# Patient Record
Sex: Female | Born: 1985 | Marital: Married | Smoking: Never smoker
Health system: Southern US, Community
[De-identification: ages and names within clinical notes are randomized; demographics above are authoritative.]

## PROBLEM LIST (undated history)

## (undated) ENCOUNTER — Inpatient Hospital Stay (HOSPITAL_COMMUNITY): Payer: Self-pay

## (undated) DIAGNOSIS — N289 Disorder of kidney and ureter, unspecified: Secondary | ICD-10-CM

## (undated) DIAGNOSIS — I10 Essential (primary) hypertension: Secondary | ICD-10-CM

## (undated) DIAGNOSIS — J45909 Unspecified asthma, uncomplicated: Secondary | ICD-10-CM

## (undated) DIAGNOSIS — E7800 Pure hypercholesterolemia, unspecified: Secondary | ICD-10-CM

## (undated) HISTORY — PX: WISDOM TOOTH EXTRACTION: SHX21

## (undated) HISTORY — PX: DILATION AND CURETTAGE OF UTERUS: SHX78

---

## 2018-07-25 ENCOUNTER — Encounter: Payer: Self-pay | Admitting: Student

## 2018-07-25 ENCOUNTER — Inpatient Hospital Stay (HOSPITAL_COMMUNITY)
Admission: AD | Admit: 2018-07-25 | Discharge: 2018-07-30 | DRG: 832 | Disposition: A | Discharge status: Home / Self Care | Payer: Managed Care, Other (non HMO) | Attending: Obstetrics and Gynecology | Admitting: Obstetrics and Gynecology

## 2018-07-25 ENCOUNTER — Other Ambulatory Visit: Payer: Self-pay

## 2018-07-25 DIAGNOSIS — O99512 Diseases of the respiratory system complicating pregnancy, second trimester: Secondary | ICD-10-CM | POA: Diagnosis present

## 2018-07-25 DIAGNOSIS — R809 Proteinuria, unspecified: Secondary | ICD-10-CM

## 2018-07-25 DIAGNOSIS — R7989 Other specified abnormal findings of blood chemistry: Secondary | ICD-10-CM | POA: Diagnosis not present

## 2018-07-25 DIAGNOSIS — Z3A27 27 weeks gestation of pregnancy: Secondary | ICD-10-CM | POA: Diagnosis not present

## 2018-07-25 DIAGNOSIS — O1212 Gestational proteinuria, second trimester: Secondary | ICD-10-CM | POA: Diagnosis not present

## 2018-07-25 DIAGNOSIS — O10912 Unspecified pre-existing hypertension complicating pregnancy, second trimester: Secondary | ICD-10-CM | POA: Diagnosis present

## 2018-07-25 DIAGNOSIS — O10012 Pre-existing essential hypertension complicating pregnancy, second trimester: Secondary | ICD-10-CM | POA: Diagnosis not present

## 2018-07-25 DIAGNOSIS — O10212 Pre-existing hypertensive chronic kidney disease complicating pregnancy, second trimester: Secondary | ICD-10-CM | POA: Diagnosis present

## 2018-07-25 DIAGNOSIS — Z3A26 26 weeks gestation of pregnancy: Secondary | ICD-10-CM

## 2018-07-25 DIAGNOSIS — N189 Chronic kidney disease, unspecified: Secondary | ICD-10-CM | POA: Diagnosis present

## 2018-07-25 DIAGNOSIS — E871 Hypo-osmolality and hyponatremia: Secondary | ICD-10-CM | POA: Diagnosis present

## 2018-07-25 DIAGNOSIS — O99212 Obesity complicating pregnancy, second trimester: Secondary | ICD-10-CM | POA: Diagnosis not present

## 2018-07-25 DIAGNOSIS — O112 Pre-existing hypertension with pre-eclampsia, second trimester: Secondary | ICD-10-CM | POA: Diagnosis present

## 2018-07-25 DIAGNOSIS — I129 Hypertensive chronic kidney disease with stage 1 through stage 4 chronic kidney disease, or unspecified chronic kidney disease: Secondary | ICD-10-CM | POA: Diagnosis present

## 2018-07-25 DIAGNOSIS — O99282 Endocrine, nutritional and metabolic diseases complicating pregnancy, second trimester: Secondary | ICD-10-CM | POA: Diagnosis present

## 2018-07-25 DIAGNOSIS — J45909 Unspecified asthma, uncomplicated: Secondary | ICD-10-CM | POA: Diagnosis not present

## 2018-07-25 DIAGNOSIS — Z363 Encounter for antenatal screening for malformations: Secondary | ICD-10-CM | POA: Diagnosis not present

## 2018-07-25 HISTORY — DX: Unspecified asthma, uncomplicated: J45.909

## 2018-07-25 HISTORY — DX: Essential (primary) hypertension: I10

## 2018-07-25 LAB — TYPE AND SCREEN
ABO/RH(D): A POS
Antibody Screen: NEGATIVE

## 2018-07-25 LAB — COMPREHENSIVE METABOLIC PANEL
ALT: 13 U/L (ref 0–44)
AST: 19 U/L (ref 15–41)
Albumin: 2.2 g/dL — ABNORMAL LOW (ref 3.5–5.0)
Alkaline Phosphatase: 58 U/L (ref 38–126)
Anion gap: 9 (ref 5–15)
BILIRUBIN TOTAL: 0.4 mg/dL (ref 0.3–1.2)
BUN: 11 mg/dL (ref 6–20)
CO2: 22 mmol/L (ref 22–32)
CREATININE: 1.29 mg/dL — AB (ref 0.44–1.00)
Calcium: 8.6 mg/dL — ABNORMAL LOW (ref 8.9–10.3)
Chloride: 105 mmol/L (ref 98–111)
GFR, EST NON AFRICAN AMERICAN: 54 mL/min — AB (ref >60)
Glucose, Bld: 70 mg/dL (ref 70–99)
Potassium: 4.1 mmol/L (ref 3.5–5.1)
Sodium: 136 mmol/L (ref 135–145)
Total Protein: 6 g/dL — ABNORMAL LOW (ref 6.5–8.1)

## 2018-07-25 LAB — CBC
HCT: 33.1 % — ABNORMAL LOW (ref 36.0–46.0)
Hemoglobin: 11 g/dL — ABNORMAL LOW (ref 12.0–15.0)
MCH: 28.5 pg (ref 26.0–34.0)
MCHC: 33.2 g/dL (ref 30.0–36.0)
MCV: 85.8 fL (ref 80.0–100.0)
Platelets: 254 10*3/uL (ref 150–400)
RBC: 3.86 MIL/uL — ABNORMAL LOW (ref 3.87–5.11)
RDW: 13.8 % (ref 11.5–15.5)
WBC: 12.5 10*3/uL — ABNORMAL HIGH (ref 4.0–10.5)
nRBC: 0 % (ref 0.0–0.2)

## 2018-07-25 LAB — ABO/RH: ABO/RH(D): A POS

## 2018-07-25 LAB — PROTEIN / CREATININE RATIO, URINE
Creatinine, Urine: 97.01 mg/dL
Protein Creatinine Ratio: 8.51 mg/mg{Cre} — ABNORMAL HIGH (ref 0.00–0.15)
Total Protein, Urine: 826 mg/dL

## 2018-07-25 MED ORDER — SODIUM CHLORIDE 0.9 % IV SOLN: 250.0000 mL | INTRAVENOUS | Status: DC | PRN

## 2018-07-25 MED ORDER — BETAMETHASONE SOD PHOS & ACET 6 (3-3) MG/ML IJ SUSP
12.0000 mg | Freq: Once | INTRAMUSCULAR | Status: AC
  Administered 2018-07-26: 12 mg via INTRAMUSCULAR
  Filled 2018-07-25: qty 2

## 2018-07-25 MED ORDER — CALCIUM CARBONATE ANTACID 500 MG PO CHEW: 2.0000 | CHEWABLE_TABLET | ORAL | Status: DC | PRN

## 2018-07-25 MED ORDER — PRENATAL MULTIVITAMIN CH
1.0000 | ORAL_TABLET | Freq: Every day | ORAL | Status: DC
  Administered 2018-07-26 – 2018-07-30 (×5): 1 via ORAL
  Filled 2018-07-25 (×5): qty 1

## 2018-07-25 MED ORDER — BETAMETHASONE SOD PHOS & ACET 6 (3-3) MG/ML IJ SUSP
12.0000 mg | Freq: Once | INTRAMUSCULAR | Status: AC
  Administered 2018-07-25: 12 mg via INTRAMUSCULAR
  Filled 2018-07-25: qty 2

## 2018-07-25 MED ORDER — ZOLPIDEM TARTRATE 5 MG PO TABS: 5.0000 mg | ORAL_TABLET | Freq: Every evening | ORAL | Status: DC | PRN

## 2018-07-25 MED ORDER — SODIUM CHLORIDE 0.9% FLUSH: 3.0000 mL | INTRAVENOUS | Status: DC | PRN

## 2018-07-25 MED ORDER — ACETAMINOPHEN 325 MG PO TABS
650.0000 mg | ORAL_TABLET | ORAL | Status: DC | PRN
  Administered 2018-07-28: 650 mg via ORAL
  Filled 2018-07-25: qty 2

## 2018-07-25 MED ORDER — SODIUM CHLORIDE 0.9% FLUSH
3.0000 mL | Freq: Two times a day (BID) | INTRAVENOUS | Status: DC
  Administered 2018-07-26 – 2018-07-29 (×8): 3 mL via INTRAVENOUS

## 2018-07-25 MED ORDER — NIFEDIPINE ER OSMOTIC RELEASE 30 MG PO TB24
90.0000 mg | ORAL_TABLET | Freq: Every day | ORAL | Status: DC
  Administered 2018-07-26: 30 mg via ORAL
  Administered 2018-07-27: 90 mg via ORAL
  Filled 2018-07-25 (×2): qty 3

## 2018-07-25 MED ORDER — DOCUSATE SODIUM 100 MG PO CAPS
100.0000 mg | ORAL_CAPSULE | Freq: Every day | ORAL | Status: DC
  Administered 2018-07-25 – 2018-07-30 (×6): 100 mg via ORAL
  Filled 2018-07-25 (×6): qty 1

## 2018-07-25 NOTE — MAU Provider Note (Signed)
Chief Complaint  Patient presents with  . Hypertension     First Provider Initiated Contact with Patient 07/25/18 1828      S: Cheryl Woods  is a 33 y.o. y.o. year old G37P1011 female at [redacted]w[redacted]d weeks gestation who presents to MAU with elevated blood pressures. Hx of chronic hypertension. Current blood pressure medication: procardia xl 90 mg daily.  Report headaches & floaters yesterday. Denies headache, visual disturbance, or epigastric pain today. Was called by her OB d/t recent labs. Had labs collected on Tuesday evening that resulted this morning. Patient with known elevated serum creatinine & proteinuria. This morning creatinine was 1.15 & 6 gms of protein in her urine.   O:  Patient Vitals for the past 24 hrs:  BP Temp Temp src Pulse SpO2 Height Weight  07/25/18 2000 118/68 - - 82 99 % - -  07/25/18 1945 (!) 142/92 - - 97 99 % - -  07/25/18 1930 (!) 143/96 - - 98 99 % - -  07/25/18 1915 135/90 - - 83 99 % - -  07/25/18 1900 134/89 - - 79 99 % - -  07/25/18 1845 138/88 - - 87 99 % - -  07/25/18 1830 (!) 133/91 - - 77 99 % - -  07/25/18 1815 (!) 144/97 98.8 F (37.1 C) Oral 82 99 % - -  07/25/18 1814 (!) 153/96 - - 84 - - -  07/25/18 1806 - - - - - 5\' 3"  (1.6 m) 90.7 kg   General: NAD Heart: Regular rate Lungs: Normal rate and effort Abd: Soft, NT, Gravid, S=D Extremities: 3+ pitting Pedal edema Neuro: 2+ deep tendon reflexes, No clonus  NST:  Baseline: 150 bpm, Variability: Good {> 6 bpm), Accelerations: Non-reactive but appropriate for gestational age and Decelerations: Absent  Results for orders placed or performed during the hospital encounter of 07/25/18 (from the past 24 hour(s))  Protein / creatinine ratio, urine     Status: Abnormal   Collection Time: 07/25/18  6:26 PM  Result Value Ref Range   Creatinine, Urine 97.01 mg/dL   Total Protein, Urine 826 mg/dL   Protein Creatinine Ratio 8.51 (H) 0.00 - 0.15 mg/mg[Cre]  CBC     Status: Abnormal   Collection Time:  07/25/18  6:43 PM  Result Value Ref Range   WBC 12.5 (H) 4.0 - 10.5 K/uL   RBC 3.86 (L) 3.87 - 5.11 MIL/uL   Hemoglobin 11.0 (L) 12.0 - 15.0 g/dL   HCT 85.0 (L) 27.7 - 41.2 %   MCV 85.8 80.0 - 100.0 fL   MCH 28.5 26.0 - 34.0 pg   MCHC 33.2 30.0 - 36.0 g/dL   RDW 87.8 67.6 - 72.0 %   Platelets 254 150 - 400 K/uL   nRBC 0.0 0.0 - 0.2 %  Comprehensive metabolic panel     Status: Abnormal   Collection Time: 07/25/18  6:43 PM  Result Value Ref Range   Sodium 136 135 - 145 mmol/L   Potassium 4.1 3.5 - 5.1 mmol/L   Chloride 105 98 - 111 mmol/L   CO2 22 22 - 32 mmol/L   Glucose, Bld 70 70 - 99 mg/dL   BUN 11 6 - 20 mg/dL   Creatinine, Ser 9.47 (H) 0.44 - 1.00 mg/dL   Calcium 8.6 (L) 8.9 - 10.3 mg/dL   Total Protein 6.0 (L) 6.5 - 8.1 g/dL   Albumin 2.2 (L) 3.5 - 5.0 g/dL   AST 19 15 - 41 U/L  ALT 13 0 - 44 U/L   Alkaline Phosphatase 58 38 - 126 U/L   Total Bilirubin 0.4 0.3 - 1.2 mg/dL   GFR calc non Af Amer 54 (L) >60 mL/min   GFR calc Af Amer >60 >60 mL/min   Anion gap 9 5 - 15    A: 1. Chronic hypertension complicating or reason for care during pregnancy, second trimester   2. [redacted] weeks gestation of pregnancy   3. Gestational proteinuria in second trimester   4. Elevated serum creatinine      P:  Admit to [facility] unit per c/w Dr. Penne Lash, Denny Peon, NP 07/25/2018 8:28 PM

## 2018-07-25 NOTE — H&P (Signed)
Cheryl Woods is a 33 y.o. female G3P1011 at 57 3/7 weeks (EDD 10/28/2018 by 8 week [place])  presenting for increasing proteinuria and blood pressures concerning for developing preeclampsia.   Prenatal care significant for [facility] with intake BP 140/90 and baseline proteinuria of 1.8grams/creatininebaseline 0.97.  By 20 weeks her BP160/100 so started on labetalol.  She has side effects of chest heaviness and SOB that ultimately made her d/c the labetalol and begin procardia.  Procardia XL requirement has been trending up and recently increased to 90mg  with good response.  She had HA and blurry vision yesterday, but none today.  Good FM.    OB History    Gravida  3   Para  1   Term  1   Preterm      AB  1   Living  1     SAB  1   TAB      Ectopic      Multiple      Live Births  1         2003 NSVD 7#7oz SAB x 1  Past Medical History:  Diagnosis Date  . Asthma   . Chronic hypertension    Past Surgical History:  Procedure Laterality Date  . DILATION AND CURETTAGE OF UTERUS    . WISDOM TOOTH EXTRACTION     Family History: family history is not on file. Social History:  reports that she has never smoked. She has never used smokeless tobacco. She reports previous alcohol use. She reports that she does not use drugs.     Maternal Diabetes: No Genetic Screening: Normal Maternal Ultrasounds/Referrals: Normal Fetal Ultrasounds or other Referrals:  None Maternal Substance Abuse:  No Significant Maternal Medications:  Meds include: Other: Procardia Significant Maternal Lab Results:  Lab values include: Other: Rubella nonimmune Other Comments:  None  Review of Systems  Eyes: Negative for blurred vision.  Cardiovascular: Positive for leg swelling.  Gastrointestinal: Negative for abdominal pain.  Neurological: Negative for headaches.   Maternal Medical History:  Contractions: Frequency: irregular.   Perceived severity is mild.    Fetal activity: Perceived fetal activity is  normal.    Prenatal complications: PIH.    FHR reassuring for 26 weeks   Blood pressure 118/68, pulse 82, temperature 98.8 F (37.1 C), temperature source Oral, height 5\' 3"  (1.6 m), weight 90.7 kg, SpO2 99 %. Maternal Exam:  Uterine Assessment: Contraction strength is mild.  Contraction frequency is rare.   Abdomen: Patient reports no abdominal tenderness.   Physical Exam  Constitutional: She appears well-developed.  Cardiovascular: Normal rate and regular rhythm.  Respiratory: Effort normal.  GI: Soft.  Musculoskeletal:        General: Edema present.  Neurological: She is alert.    Prenatal labs: ABO, Rh:  A positive Antibody:  negative Rubella:  Nonimmune RPR:   NR HBsAg:   Neg HIV:   NR GBS:   unknown Hgb AA First trimester screen negative  Assessment/Plan: Pt with CHTN and increasing antihypertensive requirements to control BP as well as increasing proteinuria and creatinine.  No criteria for delivery as of now but concerning for developing preeclampsia with underlying nephrotic sx of ? etiology.  Will admit and administer betamethasone, get fetal assessment with growth [place] and NICU consult.   Serial labs and BP to assess if a progressive process.   Oliver Pila 07/25/2018, 9:15 PM

## 2018-07-25 NOTE — MAU Note (Signed)
Pt reports the office called her and told her to come here today because of her b/p and some of her labs on  02/25. Increased swelling but denies headache or blurred vision.

## 2018-07-26 ENCOUNTER — Inpatient Hospital Stay (HOSPITAL_COMMUNITY): Payer: Managed Care, Other (non HMO)

## 2018-07-26 DIAGNOSIS — O99512 Diseases of the respiratory system complicating pregnancy, second trimester: Secondary | ICD-10-CM

## 2018-07-26 DIAGNOSIS — Z3A26 26 weeks gestation of pregnancy: Secondary | ICD-10-CM

## 2018-07-26 DIAGNOSIS — O10012 Pre-existing essential hypertension complicating pregnancy, second trimester: Secondary | ICD-10-CM

## 2018-07-26 DIAGNOSIS — O112 Pre-existing hypertension with pre-eclampsia, second trimester: Secondary | ICD-10-CM

## 2018-07-26 DIAGNOSIS — O99212 Obesity complicating pregnancy, second trimester: Secondary | ICD-10-CM

## 2018-07-26 DIAGNOSIS — J45909 Unspecified asthma, uncomplicated: Secondary | ICD-10-CM

## 2018-07-26 DIAGNOSIS — Z363 Encounter for antenatal screening for malformations: Secondary | ICD-10-CM

## 2018-07-26 LAB — CBC
HCT: 34.6 % — ABNORMAL LOW (ref 36.0–46.0)
HEMOGLOBIN: 11.5 g/dL — AB (ref 12.0–15.0)
MCH: 28.4 pg (ref 26.0–34.0)
MCHC: 33.2 g/dL (ref 30.0–36.0)
MCV: 85.4 fL (ref 80.0–100.0)
Platelets: 294 10*3/uL (ref 150–400)
RBC: 4.05 MIL/uL (ref 3.87–5.11)
RDW: 13.6 % (ref 11.5–15.5)
WBC: 12.2 10*3/uL — ABNORMAL HIGH (ref 4.0–10.5)
nRBC: 0 % (ref 0.0–0.2)

## 2018-07-26 LAB — COMPREHENSIVE METABOLIC PANEL
ALT: 12 U/L (ref 0–44)
ANION GAP: 9 (ref 5–15)
AST: 22 U/L (ref 15–41)
Albumin: 2.4 g/dL — ABNORMAL LOW (ref 3.5–5.0)
Alkaline Phosphatase: 62 U/L (ref 38–126)
BUN: 12 mg/dL (ref 6–20)
CO2: 19 mmol/L — ABNORMAL LOW (ref 22–32)
Calcium: 8.9 mg/dL (ref 8.9–10.3)
Chloride: 104 mmol/L (ref 98–111)
Creatinine, Ser: 1.31 mg/dL — ABNORMAL HIGH (ref 0.44–1.00)
GFR calc Af Amer: >60 mL/min (ref >60)
GFR calc non Af Amer: 53 mL/min — ABNORMAL LOW (ref >60)
Glucose, Bld: 109 mg/dL — ABNORMAL HIGH (ref 70–99)
Potassium: 4.8 mmol/L (ref 3.5–5.1)
Sodium: 132 mmol/L — ABNORMAL LOW (ref 135–145)
Total Bilirubin: 0.1 mg/dL — ABNORMAL LOW (ref 0.3–1.2)
Total Protein: 6.4 g/dL — ABNORMAL LOW (ref 6.5–8.1)

## 2018-07-26 NOTE — Progress Notes (Signed)
Patient ID: Cheryl Woods, female   DOB: January 31, 1986, 33 y.o.   MRN: [id number] Pt reports no HA, blurry vision, LOF or contractions. Appreciating Fms  VS - 133/92  EFM - 150, 10x10 accels, moderate variability = cat 1 TOCO - no contractions SVE - deferred  Nl LFTs, and plts unchanged  07/25/18: pr/cr ratio 8.5  2/28: [place] - siup, 22%ile ( 1lb 11oz), vertex, post plac, nl afi          A/P: 60AY [id number] at 26 4/[redacted]wks ga with cHTN and overlying preE without severe features - currently stable on 90xl procardia daily - continue to monitor         - Per MFM - BPP weekly from 28 weeks        - Monitor in house with NST q shift        - s/p BMZ 2/27 ; next due today at 1935        - NICU, MFM and Nephrology consults pending        - Plan to deliver at 34 weeks if no comps prior

## 2018-07-26 NOTE — Consult Note (Signed)
Maternal-Fetal Medicine  Name: Cheryl Woods MRN: [id number] Requesting Provider: Huel Cote, MD  [name], G3 P1011 at 26w 4d gestation was admitted yesterday with increased blood pressures prompting concerns for superimposed preeclampsia. Her blood pressures were in the 140s/90.  Patient does not have severe headache or visual disturbances or right upper quadrant pain or vaginal bleeding. She reports good fetal movements. Patient has not had severe range blood pressures since admission. She reports she had SBP of 160 mm Hg a few days ago and was evaluated at your office.  Past medical history is significant for hypertension that was diagnosed last year. Patient had normal blood pressures in early pregnancy. She was advised to take labetalol about 4 weeks ago and that was discontinued later because of shortness of breath (exacerbation of asthma). She has been taking Procardia XL 90 mg daily with good control.   In October 2019, the baseline protein/creatine ratio was 1.8 and baseline serum creatinine was 0.97.  She does not have diabetes or any other chronic medical conditions. Patient has stable intermittent asthma. She experiences panic attacks sometimes that are related to her anxiety about her medical conditions.  PSH: D&C, wisdom teeth removal. Medications: Prenatal vitamins, low-dose aspirin, Procardia, pantoprazole (discontinued now) and diclegis prn. Allergies: Trazadone (shortness of breath) and morphine (headache). Social: Denies tobacco or drug or alcohol use. She has been married since May 2020 and her husband is in good health. He is not the father of her first child. Family: Father has diabetes. No history of venous thromboembolism in the family. Obstetric history is significant for a term vaginal delivery in 2003 of a female infant weighing 7-7 at birth. Her daughter is in good health. She also had D&C in 2019 following the diagnosis of early embryonic failure on  ultrasound. Gyn: History of abnormal Pap smear and cryosurgery.   Prenatal: Patient reports she had low risk for fetal aneuploidies on screening. She has not had screening for GDM.  P/E: Patient is comfortably lying in bed; not in distress. BP since admission: 125-155/88-97 mm Hg. Afebrile HEENT: No lymphadenopathy; normal. Abd: Soft gravid uterus; no tenderness. Bilateral pedal edema present. NST: Baseline 150s; moderate variability and reactive for this gestational age.  Labs: Hb 11, Hct 33.1, PLT 254, WBC 12.5, creatinine 1.31, protein/creatinine ratio 8.5.  On ultrasound performed (MFM), the fetal growth is appropriate for gestational age. Amniotic fluid is normal and good fetal activity is seen. Fetal anatomy appears normal.  I counseled the couple on the following: Chronic hypertension with superimposed preeclampsia: -It is difficult to diagnose superimposed preeclampsia. Increased proteinuria from baseline is one of the signs.  -Absence of maternal symptoms is reassuring. -I explained features of preeclampsia (mild and severe) and its possible complications including maternal stroke, eclampsia, pulmonary edema, renal failure, coagulation failure and placental abruption. -I reassured the patient of normal fetal growth (no placental insufficiency). -It is reasonable to observe her as inpatient for a few days and see the pattern of blood pressure readings. If blood pressures recur in the severe range or need additional antihypertensives to control, we would recommend continued inpatient management, possibly, till delivery. -Discussed the benefits of antenatal corticosteroids. -Discussed magnesium sulfate infusion for seizure prophylaxis as well as for fetal neuroprotection. -If blood pressures continue to be in the milder range, outpatient management may be considered provided other complications are not present. -Increased serum creatinine is of concern, but it is reassuring to note  that she has good urine output. Serum magnesium levels should  be monitored closely if patient receives magnesium sulfate. If creatine doubles from her baseline value of 0.97, delivery would be considered. -Timing of delivery is more-likely to be at 34 weeks. However, it will be decided on her clinical course from now.  Recommendations: -Inpatient management to continue and discuss with MFM on Monday after blood work. -BP monitoring as per protocol. -CBC, LFTs on Monday. -Twice-daily NST. -Weekly BPP from 28 weeks. -Continue Procardia. -IV antihypertensive management if severe hypertension recurs.  Delivery is recommended if the following are present: -Severe range blood pressures not controlled by emergent antihypertensive management. -Abnormal labs suggestive of HELLP syndrome. -Maternal severe symptoms of preeclampsia or maternal complications. -Nonreassuring fetal status. -Placental abruption. -Increasing serum creatinine (doubling from baseline). -Protein/creatinine estimation is of little value now in the management.    Thank you for consultation. Face-to-face counseling 40 minutes.

## 2018-07-27 MED ORDER — LABETALOL HCL 5 MG/ML IV SOLN: 40.0000 mg | INTRAVENOUS | Status: DC | PRN

## 2018-07-27 MED ORDER — LABETALOL HCL 5 MG/ML IV SOLN: 80.0000 mg | INTRAVENOUS | Status: DC | PRN

## 2018-07-27 MED ORDER — LABETALOL HCL 5 MG/ML IV SOLN
20.0000 mg | INTRAVENOUS | Status: DC | PRN
  Filled 2018-07-27: qty 4

## 2018-07-27 MED ORDER — NIFEDIPINE ER OSMOTIC RELEASE 30 MG PO TB24
60.0000 mg | ORAL_TABLET | Freq: Two times a day (BID) | ORAL | Status: DC
  Administered 2018-07-28 – 2018-07-30 (×6): 60 mg via ORAL
  Filled 2018-07-27 (×7): qty 2

## 2018-07-27 MED ORDER — NIFEDIPINE ER OSMOTIC RELEASE 30 MG PO TB24: 120.0000 mg | ORAL_TABLET | Freq: Every day | ORAL | Status: DC

## 2018-07-27 MED ORDER — HYDRALAZINE HCL 20 MG/ML IJ SOLN: 10.0000 mg | INTRAMUSCULAR | Status: DC | PRN

## 2018-07-27 NOTE — Progress Notes (Addendum)
Patient ID: Cheryl Woods, female   DOB: Feb 16, 1986, 33 y.o.   MRN: [id number] Pt anxious and worried about BP. She denies persistent HA or blurry vision. She is appreciating FMs We discussed her persistent elevated BP this overnight and this am. Discussed long and short term goals of hospitalization - ideally deliver at [redacted] weeks gestation; likely need for booster of BMZ if BP adequately controlled and kidney function remains manageable Answered all questions from pt and husband Understands will likely stay hospitalized till delivery  Also spoke to MFM Dr Judeth Cornfield yesterday and to Nephrologist Dr today - grateful for their input.   VSS: 143/88 ( 148-160/94-103 since midnight)  EFM 145, reassuring for ga TOCO - no contractions SVE - deferred 2/28: [place] - vertex, nl afi  A/P: 32yo [id number] at 26 5/[redacted]wks ga with cHTN and overlying preE without severe features, abnormal kidney function; monitor BP         -Agree with nephrology to increase procardia dosage: 60xl bid                 - start 24hr urine collection for total protein and creatinine cl             - Per MFM - BPP weekly from 28 weeks; first on 08/05/18; ordered        - Monitor in house with NST q shift        - s/p BMZ 2/27 and 2/28        - Once again: thank you to nephrology and MFM for input; pending             NICU consult        - Plan to deliver at 34 weeks if no comps prior

## 2018-07-27 NOTE — Progress Notes (Signed)
24 hour urine started.

## 2018-07-27 NOTE — Consult Note (Signed)
Neonatology Consult Note:  At the request of the patients obstetrician Dr. Terri Piedra I met with Cheryl Woods who is at   60 5/[redacted]wks ga with cHTN and overlying preE without severe features.   s/p BMZ 2/27 and 2/28. We discussed morbidity/mortality at this gestional age, delivery room resuscitation, including intubation and surfactant in DR.  Discussed mechanical ventilation and risk for chronic lung disease, risk for IVH with potential for motor / cognitive deficits, ROP, NEC, sepsis, as well as temperature instability and feeding immaturity.  Discussed NG / OG feeds, benefits of MBM in reducing incidence of NEC.   Discussed likely length of stay. Thank you for allowing [place] to participate in her care.  Please call with questions.  Higinio Roger, DO  Neonatologist  The total length of face-to-face or floor / unit time for this encounter was 25 minutes.  Counseling and / or coordination of care was greater than fifty percent of the time.

## 2018-07-27 NOTE — Consult Note (Signed)
Reason for Consult: Preeclampsia and AKI Referring Physician: Mindi SlickerBanga, MD  Cheryl Gullingatalie Woods is an 33 y.o. female.  HPI: Mrs. Cheryl Woods is a 33 yo [id number] at almost [redacted] weeks gestation with a PMH significant for pre-existing HTN and asthma who developed worsening BP's at home over the past month.  She was started on labetalol, however developed some shortness of breath so was changed to procardia ER at 30mg  daily, however her BP's did not improve and was increased to 60mg  and then to 90mg .  She was also noted to have a spike in her proteinuria and [facility] for concern of developing preeclampsia.  We were consulted to help further evaluate and manage her HTN and preeclampsia.  She has also developed some AKI with her creatinine up to 1.31 yesterday.  Her BP's have been ranging as low as 130's but as high as 160 with DBP up to 103.    She denies any N/V/D, HA, CP, or SOB.  She did have some dysuria earlier but no hematuria, pyuria, urgency, frequency, or retention.  She has had lower extremity edema for over a month without significant worsening.   Trend in Creatinine: Creatinine, Ser  Date/Time Value Ref Range Status  07/26/2018 07:50 AM 1.31 (H) 0.44 - 1.00 mg/dL Final  45/40/981102/27/2020 91:4706:43 PM 1.29 (H) 0.44 - 1.00 mg/dL Final    PMH:   Past Medical History:  Diagnosis Date  . Asthma   . Chronic hypertension     PSH:   Past Surgical History:  Procedure Laterality Date  . DILATION AND CURETTAGE OF UTERUS    . WISDOM TOOTH EXTRACTION      Allergies:  Allergies  Allergen Reactions  . Avocado Swelling    Throat swelling   . Trazodone And Nefazodone Anaphylaxis  . Morphine And Related Nausea Only    Headaches     Medications:   Prior to Admission medications   Medication Sig Start Date End Date Taking? Authorizing Provider  NIFEdipine (PROCARDIA XL/NIFEDICAL XL) 60 MG 24 hr tablet TK 1 T PO QD 07/09/18  Yes [provider]  NIFEdipine  (PROCARDIA-XL/NIFEDICAL-XL) 30 MG 24 hr tablet TK 1 T PO BID 06/21/18  Yes [provider]  Prenatal Vit-Fe Fumarate-FA (PRENATAL MULTIVITAMIN) TABS tablet Take 1 tablet by mouth daily at 12 noon.   Yes [provider]    Inpatient medications: . docusate sodium  100 mg Oral Daily  . NIFEdipine  60 mg Oral BID  . prenatal multivitamin  1 tablet Oral Q1200  . sodium chloride flush  3 mL Intravenous Q12H    Discontinued Meds:   Medications Discontinued During This Encounter  Medication Reason  . NIFEdipine (PROCARDIA-XL/NIFEDICAL-XL) 24 hr tablet 90 mg   . NIFEdipine (PROCARDIA-XL/NIFEDICAL-XL) 24 hr tablet 120 mg     Social History:  reports that she has never smoked. She has never used smokeless tobacco. She reports previous alcohol use. She reports that she does not use drugs.  Family History:  History reviewed. No pertinent family history.  Pertinent items are noted in HPI. Weight change: -1.361 kg  Intake/Output Summary (Last 24 hours) at 07/27/2018 1632 Last data filed at 07/27/2018 1000 Gross per 24 hour  Intake 1560 ml  Output 2600 ml  Net -1040 ml   BP (!) 144/95 (BP Location: Right Arm)   Pulse 67   Temp 98.3 F (36.8 C) (Oral)   Resp 17   Ht 5\' 3"  (1.6 m)   Wt  89.4 kg   SpO2 100%   BMI 34.90 kg/m  Vitals:   07/27/18 1039 07/27/18 1200 07/27/18 1254 07/27/18 1628  BP: (!) 150/94 (!) 143/88 (!) 148/91 (!) 144/95  Pulse: 80 87 88 67  Resp:  Temp:  98.7 F (37.1 C)  98.3 F (36.8 C)  TempSrc:  Oral  Oral  SpO2:  99%  100%  Weight:      Height:         General appearance: alert, cooperative, no distress and somewhat anxious Head: Normocephalic, without obvious abnormality, atraumatic Eyes: negative findings: lids and lashes normal, conjunctivae and sclerae normal and corneas clear Resp: clear to auscultation bilaterally Cardio: regular rate and rhythm, S1, S2 normal, no murmur, click, rub or gallop GI: distended with appropriate  sized uterus, +BS, soft, NT Extremities: edema 1+ bilateral lower extremities  Labs: Basic Metabolic Panel: Recent Labs  Lab 07/25/18 1843 07/26/18 0750  NA 136 132*  K 4.1 4.8  CL 105 104  CO2 22 19*  GLUCOSE 70 109*  BUN 11 12  CREATININE 1.29* 1.31*  ALBUMIN 2.2* 2.4*  CALCIUM 8.6* 8.9   Liver Function Tests: Recent Labs  Lab 07/25/18 1843 07/26/18 0750  AST 19 22  ALT 13 12  ALKPHOS 58 62  BILITOT 0.4 0.1*  PROT 6.0* 6.4*  ALBUMIN 2.2* 2.4*   No results for input(s): LIPASE, AMYLASE in the last 168 hours. No results for input(s): AMMONIA in the last 168 hours. CBC: Recent Labs  Lab 07/25/18 1843 07/26/18 0750  WBC 12.5* 12.2*  HGB 11.0* 11.5*  HCT 33.1* 34.6*  MCV 85.8 85.4  PLT 254 294   PT/INR: (inr:5) Cardiac Enzymes: )No results for input(s): CKTOTAL, CKMB, CKMBINDEX, TROPONINI in the last 168 hours. CBG: No results for input(s): GLUCAP in the last 168 hours.  Iron Studies: No results for input(s): IRON, TIBC, TRANSFERRIN, FERRITIN in the last 168 hours.  Xrays/Other Studies: [place] Mfm Ob Detail +14 Wk  Result Date: 07/26/2018 ----------------------------------------------------------------------  OBSTETRICS REPORT                       (Signed Final 07/26/2018 09:23 am) ---------------------------------------------------------------------- Patient Info  ID #:       [id number]                          D.O.B.:  07/29/1985 (32 yrs)  Name:       Cheryl Woods                    Visit Date: 07/26/2018 07:48 am ---------------------------------------------------------------------- Performed By  Performed By:     Tomma Lightning             Ref. Address:     [address]                    [place]                                                             Road  Attending:        Noralee Space MD        [facility]  Referred By:      Judeth Horn                     CNM ---------------------------------------------------------------------- Orders   #  Description                          Code         Ordered By   1  [place] MFM OB DETAIL +14 WK              76811.01     Judeth Horn  ----------------------------------------------------------------------   #  Order #                    Accession #                 Episode #   1  [id number]                  [id number]                  [id number]  ---------------------------------------------------------------------- Indications   Hypertension - Chronic/Pre-existing            O10.019   (procardia)   [redacted] weeks gestation of pregnancy                Z3A.26   Obesity complicating pregnancy, second         O99.212   trimester (Pre Pregnancy BMI 32)   Asthma                                         O99.89 j45.909   Encounter for antenatal screening for          Z36.3   malformations  ---------------------------------------------------------------------- Vital Signs  Weight (lb): 199                               Height:        5'3"  BMI:         35.25 ---------------------------------------------------------------------- Fetal Evaluation  Num Of Fetuses:         1  Fetal Heart Rate(bpm):  157  Cardiac Activity:       Observed  Presentation:           Cephalic  Placenta:               Posterior  P. Cord Insertion:      Visualized  Amniotic Fluid  AFI FV:      Within normal limits                              Largest Pocket(cm)                              7.62 ---------------------------------------------------------------------- Biometry  BPD:      64.8  mm     G. Age:  26w 2d         26  %    CI:           82   %    70 - 86  FL/HC:      19.4   %    18.6 - 20.4  HC:      225.8  mm     G. Age:  24w 4d        < 3  %    HC/AC:      1.09        1.05 - 1.21  AC:       208   mm     G. Age:  25w 3d         12  %    FL/BPD:     67.7   %    71 - 87  FL:       43.9  mm     G. Age:  24w 3d         < 3  %    FL/AC:      21.1   %    20 - 24  HUM:      40.3  mm     G. Age:  24w 3d        < 5  %  CER:      31.1  mm     G. Age:  27w 1d         63  %  LV:          6  mm  CM:        2.8  mm  Est. FW:     758  gm    1 lb 11 oz      22  % ---------------------------------------------------------------------- OB History  Gravidity:    3         Term:   1         SAB:   1  Living:       1 ---------------------------------------------------------------------- Gestational Age  Clinical EDD:  26w 4d                                        EDD:   10/28/18  U/S Today:     25w 1d                                        EDD:   11/07/18  Best:          26w 4d     Det. By:  Clinical EDD             EDD:   10/28/18 ---------------------------------------------------------------------- Anatomy  Cranium:               Appears normal         LVOT:                   Appears normal  Cavum:                 Appears normal         Aortic Arch:            Appears normal  Ventricles:            Appears normal         Ductal Arch:            Appears normal  Choroid Plexus:  Appears normal         Diaphragm:              Appears normal  Cerebellum:            Appears normal         Stomach:                Appears normal, left                                                                        sided  Posterior Fossa:       Appears normal         Abdomen:                Appears normal  Nuchal Fold:           Not applicable (>20    Abdominal Wall:         Appears nml (cord                         wks GA)                                        insert, abd wall)  Face:                  Appears normal         Cord Vessels:           Appears normal (3                         (orbits and profile)                           vessel cord)  Lips:                  Appears normal         Kidneys:                Appear normal  Palate:                Not well visualized    Bladder:                Appears normal  Thoracic:              Appears normal          Spine:                  Not well visualized  Heart:                 Appears normal         Upper Extremities:      Appears normal                         (4CH, axis, and                         situs)  RVOT:                  Appears normal         Lower Extremities:      Appears normal  Other:  Female gender. Heels visualized. Open hands visualized. Nasal bone          visualized. Technically difficult due to fetal position. ---------------------------------------------------------------------- Cervix Uterus Adnexa  Cervix  Not visualized (advanced GA >24wks)  Uterus  Single fibroid noted, see table below.  Left Ovary  Not visualized.  Right Ovary  Size(cm)       3.8  x    3     x  3.5       Vol(ml): 20.89  Within normal limits. ---------------------------------------------------------------------- Myomas   Site                     L(cm)      W(cm)      D(cm)      Location   Posterior, RT            4.7        4.2        3.6  ----------------------------------------------------------------------   Blood Flow                 RI        PI       Comments  ---------------------------------------------------------------------- Impression  Ms. Harner, G3 P1 at 26w 4d gestation, is admitted with the  diagnosis of preeclampsia without severe features.  Fetal growth is appropriate for gestational age. Amniotic fluid  is normal and good fetal activity is seen. Fetal anatomy  appears normal, but limited by advanced gestational age. ---------------------------------------------------------------------- Recommendations  -Weekly BPP from 28 weeks. ----------------------------------------------------------------------                  Noralee Space, MD Electronically Signed Final Report   07/26/2018 09:23 am ----------------------------------------------------------------------    Assessment/Plan: 1.  Preeclampsia superimposed upon pre-existing HTN- thankfully without worrisome symptoms.  Will check LFT's to r/o HELLP  syndrome.  Will increase procardia to 60mg  bid and follow.  Will quantify proteinuria with 24 hour collection.  Mg per MFM as well as corticosteroids. 2. HTN- as above increase procardia to 60mg  bid.  Could add aldomet if needed 3. AKI- will check UA and follow renal function.  If evidence of hematuria as well as proteinuria will start acute GN workup.  If renal function continues to worsen will order renal US. 4. Hyponatremia- likely related to edema and will follow.  Hold off on diuretics for now.   Julien Nordmann Efton Thomley 07/27/2018, 4:32 PM

## 2018-07-28 LAB — URINALYSIS, COMPLETE (UACMP) WITH MICROSCOPIC
Bilirubin Urine: NEGATIVE
Glucose, UA: NEGATIVE mg/dL
Hgb urine dipstick: NEGATIVE
Ketones, ur: NEGATIVE mg/dL
Leukocytes,Ua: NEGATIVE
Nitrite: NEGATIVE
PROTEIN: 100 mg/dL — AB
Specific Gravity, Urine: 1.012 (ref 1.005–1.030)
pH: 7 (ref 5.0–8.0)

## 2018-07-28 LAB — RENAL FUNCTION PANEL
Albumin: 1.9 g/dL — ABNORMAL LOW (ref 3.5–5.0)
Anion gap: 10 (ref 5–15)
BUN: 19 mg/dL (ref 6–20)
CO2: 21 mmol/L — ABNORMAL LOW (ref 22–32)
Calcium: 8.2 mg/dL — ABNORMAL LOW (ref 8.9–10.3)
Chloride: 105 mmol/L (ref 98–111)
Creatinine, Ser: 1.31 mg/dL — ABNORMAL HIGH (ref 0.44–1.00)
GFR calc non Af Amer: 53 mL/min — ABNORMAL LOW (ref >60)
Glucose, Bld: 83 mg/dL (ref 70–99)
PHOSPHORUS: 3.2 mg/dL (ref 2.5–4.6)
Potassium: 4.1 mmol/L (ref 3.5–5.1)
Sodium: 136 mmol/L (ref 135–145)

## 2018-07-28 LAB — TYPE AND SCREEN
ABO/RH(D): A POS
Antibody Screen: NEGATIVE

## 2018-07-28 LAB — PROTEIN, URINE, 24 HOUR
Collection Interval-UPROT: 24 hours
Protein, 24H Urine: 13851 mg/d — ABNORMAL HIGH (ref 50–100)
Protein, Urine: 513 mg/dL
Urine Total Volume-UPROT: 2700 mL

## 2018-07-28 NOTE — Progress Notes (Addendum)
Patient ID: Cheryl Woods, female   DOB: [birth year], 33 y.o.   MRN: [id number] Pt reports " I'm going stir crazy". She has a mild HA but no visual changes and tolerable. +fms, no contractions, no abnl discharge VS - 137/79 ( had outlier of 153/107 but nl on repeat a few mins later) ABD - cw ga EXT - no homans  24hr urine collection - ongoing CMP: mild hyponatremia with stable cr at 1.3  A/P: 32yo [id number] at 26 6/[redacted]wks ga with cHTN, overlying preE without severe features, AKI           BP improved with procardia 60xl BID; HA likely due to procardia dose change          Ongoing 24hr urine collection, UA per nephrology          BPP weekly from 28 weeks; first on 08/05/18; ordered   NST q shift          S/p BMZ 2/27 and 2/28; consider booster before delivery at 34 weeks          MFM requests update on on mgmt 07/29/18          Pt ok to ambulate and sit at end of hallways

## 2018-07-29 ENCOUNTER — Inpatient Hospital Stay (HOSPITAL_COMMUNITY): Payer: Managed Care, Other (non HMO)

## 2018-07-29 LAB — PROTEIN / CREATININE RATIO, URINE
Creatinine, Urine: 128.99 mg/dL
Protein Creatinine Ratio: 8.43 mg/mg{Cre} — ABNORMAL HIGH (ref 0.00–0.15)
Total Protein, Urine: 1088 mg/dL

## 2018-07-29 MED ORDER — PANTOPRAZOLE SODIUM 40 MG PO TBEC
40.0000 mg | DELAYED_RELEASE_TABLET | Freq: Every day | ORAL | Status: DC
  Administered 2018-07-29 – 2018-07-30 (×2): 40 mg via ORAL
  Filled 2018-07-29 (×2): qty 1

## 2018-07-29 MED ORDER — ASPIRIN EC 81 MG PO TBEC
81.0000 mg | DELAYED_RELEASE_TABLET | Freq: Every day | ORAL | Status: DC
  Administered 2018-07-29 – 2018-07-30 (×2): 81 mg via ORAL
  Filled 2018-07-29 (×2): qty 1

## 2018-07-29 NOTE — Progress Notes (Signed)
Patient ID: Cheryl Woods, female   DOB: 06-Aug-1985, 33 y.o.   MRN: [id number] Pt reports no HA or symptoms.  +FM  BP stable 138-141/91-95 on Procardia XL 60mg  po BID FHR reassuring for gestational age  Weight stable, urine output good  Appreciate nephrology input, labs pending in AM to begin w/u  to determine source of baseline nephrotic syndrome and will assess creatinine Dr. Judeth Cornfield updated this AM and in agreement Restart baby ASA

## 2018-07-29 NOTE — Progress Notes (Signed)
[facility] NEPHROLOGY PROGRESS NOTE  Assessment/ Plan: Pt is a 33 y.o. yo female with 27 weeks of gestation, history of chronic hypertension, asthma consulted for AKI and proteinuria.  #Preeclampsia superimposed on pre-existing hypertension: Patient with nephrotic range proteinuria, no RBCs in the urine. No HELLP syndrome. 24-hour urine collection with 13 g of protein concerning for possible underlying GN ( ?FSGS, minimal disease vs membranous).  I will order serologies including ANA, double-stranded DNA antibody, complements,k/l ratio, hep B, C and HIV.  Order ultrasound kidneys to evaluate the echogenicity and rule out obstruction.  Blood pressure is better controlled.  Starting aspirin defer to high risk OB team.  Patient will likely need further evaluation including kidney biopsy after postpartum.  I have discussed this with the patient and her husband today. -Timing of delivery will defer to [facility] team.  # Hypertension: Blood pressure is better controlled now.  Continue Procardia.  Avoid hypotensive episode.  #Acute kidney injury with proteinuria: Serum creatinine level stable.  GN work-up as discussed above.  #Hyponatremia: Improved.  Monitor electrolytes.  Subjective: Seen and examined at bedside.  Patient denied headache, dizziness, nausea, vomiting, chest pain, shortness of breath.  No urinary complaint.  No family history of kidney disease however her biological father has history of diabetes and her sister has hypertension. Objective Vital signs in last 24 hours: Vitals:   07/29/18 0846 07/29/18 1225 07/29/18 1226 07/29/18 1606  BP:  (!) 138/91  (!) 141/95  Pulse:  90  (!) 103  Resp: 16  18 18   Temp: 98.2 F (36.8 C)  98.6 F (37 C) 98.4 F (36.9 C)  TempSrc: Oral  Oral Oral  SpO2: 99%  98% 99%  Weight: 89.9 kg     Height:       Weight change:   Intake/Output Summary (Last 24 hours) at 07/29/2018 1642 Last data filed at 07/29/2018 0813 Gross per 24 hour   Intake 480 ml  Output 900 ml  Net -420 ml       Labs: Basic Metabolic Panel: Recent Labs  Lab 07/25/18 1843 07/26/18 0750 07/28/18 0649  NA 136 132* 136  K 4.1 4.8 4.1  CL 105 104 105  CO2 22 19* 21*  GLUCOSE 70 109* 83  BUN 11 12 19   CREATININE 1.29* 1.31* 1.31*  CALCIUM 8.6* 8.9 8.2*  PHOS  --   --  3.2   Liver Function Tests: Recent Labs  Lab 07/25/18 1843 07/26/18 0750 07/28/18 0649  AST 19 22  --   ALT 13 12  --   ALKPHOS 58 62  --   BILITOT 0.4 0.1*  --   PROT 6.0* 6.4*  --   ALBUMIN 2.2* 2.4* 1.9*   No results for input(s): LIPASE, AMYLASE in the last 168 hours. No results for input(s): AMMONIA in the last 168 hours. CBC: Recent Labs  Lab 07/25/18 1843 07/26/18 0750  WBC 12.5* 12.2*  HGB 11.0* 11.5*  HCT 33.1* 34.6*  MCV 85.8 85.4  PLT 254 294   Cardiac Enzymes: No results for input(s): CKTOTAL, CKMB, CKMBINDEX, TROPONINI in the last 168 hours. CBG: No results for input(s): GLUCAP in the last 168 hours.  Iron Studies: No results for input(s): IRON, TIBC, TRANSFERRIN, FERRITIN in the last 72 hours. Studies/Results: No results found.  Medications: Infusions: . sodium chloride      Scheduled Medications: . docusate sodium  100 mg Oral Daily  . NIFEdipine  60 mg Oral BID  . pantoprazole  40  mg Oral Daily  . prenatal multivitamin  1 tablet Oral Q1200  . sodium chloride flush  3 mL Intravenous Q12H    have reviewed scheduled and prn medications.  Physical Exam: General:NAD, comfortable Heart:RRR, s1s2 nl, no rubs Lungs:clear b/l, no crackle Abdomen:soft, distended with pregnancy Extremities: Trace ankle edema Neurology: Alert, awake, oriented x3  Page Pucciarelli Jaynie Collins 07/29/2018,4:42 PM  LOS: 4 days

## 2018-07-29 NOTE — Progress Notes (Signed)
Patient ID: Cheryl Woods, female   DOB: 12-09-1985, 33 y.o.   MRN: [id number] HD #5 CHTN with probable superimposed preeclampsia  Pt reports had episode of emesis this AM, but this is not unusual for her with GERD and needs protonix reordered.   Good FM.  No HA.    BP 140-150/88-96 Abdomen gravid NT  FHR reassuring for 27 weeks on NST q shift BP stable on 60mg  Procardai XL BID 24 hour urine had 13.8 grams protein, creatinine stable since admission, will plan labs QOD--next tomorrow AM No severe features to indicate need to deliver.  Will follow closely

## 2018-07-30 LAB — CBC
HEMATOCRIT: 29.2 % — AB (ref 36.0–46.0)
HEMOGLOBIN: 9.8 g/dL — AB (ref 12.0–15.0)
MCH: 29 pg (ref 26.0–34.0)
MCHC: 33.6 g/dL (ref 30.0–36.0)
MCV: 86.4 fL (ref 80.0–100.0)
Platelets: 207 10*3/uL (ref 150–400)
RBC: 3.38 MIL/uL — ABNORMAL LOW (ref 3.87–5.11)
RDW: 13.6 % (ref 11.5–15.5)
WBC: 10.2 10*3/uL (ref 4.0–10.5)
nRBC: 0.2 % (ref 0.0–0.2)

## 2018-07-30 LAB — COMPREHENSIVE METABOLIC PANEL
ALK PHOS: 57 U/L (ref 38–126)
ALT: 15 U/L (ref 0–44)
AST: 21 U/L (ref 15–41)
Albumin: 1.8 g/dL — ABNORMAL LOW (ref 3.5–5.0)
Anion gap: 6 (ref 5–15)
BUN: 17 mg/dL (ref 6–20)
CO2: 22 mmol/L (ref 22–32)
Calcium: 8.6 mg/dL — ABNORMAL LOW (ref 8.9–10.3)
Chloride: 107 mmol/L (ref 98–111)
Creatinine, Ser: 1.26 mg/dL — ABNORMAL HIGH (ref 0.44–1.00)
GFR calc Af Amer: >60 mL/min (ref >60)
GFR calc non Af Amer: 56 mL/min — ABNORMAL LOW (ref >60)
Glucose, Bld: 82 mg/dL (ref 70–99)
Potassium: 3.9 mmol/L (ref 3.5–5.1)
Sodium: 135 mmol/L (ref 135–145)
Total Bilirubin: 0.5 mg/dL (ref 0.3–1.2)
Total Protein: 5.2 g/dL — ABNORMAL LOW (ref 6.5–8.1)

## 2018-07-30 LAB — HEPATITIS B SURFACE ANTIGEN: Hepatitis B Surface Ag: NEGATIVE

## 2018-07-30 LAB — HIV ANTIBODY (ROUTINE TESTING W REFLEX): HIV Screen 4th Generation wRfx: NONREACTIVE

## 2018-07-30 MED ORDER — PANTOPRAZOLE SODIUM 40 MG PO TBEC: 40.0000 mg | DELAYED_RELEASE_TABLET | Freq: Every day | ORAL | 3 refills | Status: AC

## 2018-07-30 MED ORDER — NIFEDIPINE ER 60 MG PO TB24: 60.0000 mg | ORAL_TABLET | Freq: Two times a day (BID) | ORAL | 3 refills | Status: DC

## 2018-07-30 NOTE — Progress Notes (Signed)
Pt given  Discharge instruction and   Literature  Given on [facility]

## 2018-07-30 NOTE — Discharge Instructions (Signed)
° °  Hypertension During Pregnancy  Hypertension is also called high blood pressure. High blood pressure means that the force of your blood moving in your body is too strong. When you are pregnant, this condition should be watched carefully. It can cause problems for you and your baby. Follow these instructions at home: Eating and drinking   Drink enough fluid to keep your pee (urine) pale yellow.  Avoid caffeine. Lifestyle  Do not use any products that contain nicotine or tobacco, such as cigarettes and e-cigarettes. If you need help quitting, ask your doctor.  Do not use alcohol or drugs.  Avoid stress.  Rest and get plenty of sleep. General instructions  Take over-the-counter and prescription medicines only as told by your doctor.  While lying down, lie on your left side. This keeps pressure off your major blood vessels.  While sitting or lying down, raise (elevate) your feet. Try putting some pillows under your lower legs.  Exercise regularly. Ask your doctor what kinds of exercise are best for you.  Keep all prenatal and follow-up visits as told by your doctor. This is important. Contact a doctor if:  You have symptoms that your doctor told you to watch for, such as: ? Throwing up (vomiting). ? Feeling sick to your stomach (nausea). ? Headache. Get help right away if you have:  Very bad belly pain that does not get better with treatment.  A very bad headache that does not get better.  Throwing up that does not get better with treatment.  Sudden, fast weight gain.  Sudden swelling in your hands, ankles, or face.  Bleeding from your vagina.  Blood in your pee.  Fewer movements from your baby than usual.  Blurry vision.  Double vision.  Muscle twitching.  Sudden muscle tightening (spasms).  Trouble breathing.  Blue fingernails or lips. Summary  Hypertension is also called high blood pressure. High blood pressure means that the force of your blood  moving in your body is too strong.  When you are pregnant, this condition should be watched carefully. It can cause problems for you and your baby.  Get help right away if you have symptoms that your doctor told you to watch for. This information is not intended to replace advice given to you by your health care provider. Make sure you discuss any questions you have with your health care provider. Document Released: 06/17/2010 Document Revised: 05/01/2017 Document Reviewed: 01/25/2016 Elsevier Interactive Patient Education  2019 [employer] Preeclampsia precautions

## 2018-07-30 NOTE — Progress Notes (Addendum)
BP stable on Procardia, labs stable this morning, she feels fine.  Baseline Cre 0.97 I will d/c her home for close f/u in the office, she is a ware to call with any preeclampsia signs/sx

## 2018-07-30 NOTE — Progress Notes (Signed)
HD #6, [redacted]W[redacted]D, CHTN with preeclampsia Feels ok, nothing new Afeb, VSS, BP 130-150/90s FHT- appropriate for 27 weeks Fundus NT CMP Latest Ref Rng & Units 07/30/2018 07/28/2018 07/26/2018  Glucose 70 - 99 mg/dL 82 83 191(Y)  BUN 6 - 20 mg/dL 17 19 12   Creatinine 0.44 - 1.00 mg/dL 6.06(Y) 0.45(T) 9.77(S)  Sodium 135 - 145 mmol/L 135 136 132(L)  Potassium 3.5 - 5.1 mmol/L 3.9 4.1 4.8  Chloride 98 - 111 mmol/L 107 105 104  CO2 22 - 32 mmol/L 22 21(L) 19(L)  Calcium 8.9 - 10.3 mg/dL 1.4(E) 3.9(R) 8.9  Total Protein 6.5 - 8.1 g/dL 5.2(L) - 6.4(L)  Total Bilirubin 0.3 - 1.2 mg/dL 0.5 - 3.2(Y)  Alkaline Phos 38 - 126 U/L 57 - 62  AST 15 - 41 U/L 21 - 22  ALT 0 - 44 U/L 15 - 12  BP currently stable on Procardia XL 60 mg bid, Cre slightly improved.  Will continue close observation

## 2018-07-30 NOTE — Discharge Summary (Signed)
Physician Discharge Summary  Patient ID: Cheryl Woods MRN: [id number] DOB/AGE: [birth year] 33 y.o.  Admit date: 07/25/2018 Discharge date: 07/30/2018  Admission Diagnoses:  CHTN, IUP at 26 weeks  Discharge Diagnoses: CHTN, superimposed preeclampsia, underlying kidney disease-nephrotic syndrome, IUP at 27 weeks Active Problems:   Chronic hypertension complicating or reason for care during pregnancy, second trimester   Discharged Condition: good  Hospital Course: Pt admitted on 2-27 by Dr. Senaida Ores for increasing BP.  She had MFM consult, ultrasound with appropriate growth.  Labs with elevated creatinine and significant proteinuria, nephrology consulted, labs ordered.  BP stabilized with Procardia XL 60 mg bid, she received betamethasone, labs remained stable, baby looked normal on monitoring, felt to be stable enough for discharge on 3-3  Consults: nephrology and MFM  Discharge Exam: Blood pressure (!) 144/91, pulse 89, temperature 98.5 F (36.9 C), temperature source Oral, resp. rate 18, height 5\' 3"  (1.6 m), weight 88.9 kg, SpO2 99 %. General appearance: alert  Disposition: Discharge disposition: 01-Home or Self Care       Discharge Instructions    Discharge activity:   Complete by:  As directed    No strenuous activity   Discharge diet:  No restrictions   Complete by:  As directed    Fetal Kick Count:  Lie on our left side for one hour after a meal, and count the number of times your baby kicks.  If it is less than 5 times, get up, move around and drink some juice.  Repeat the test 30 minutes later.  If it is still less than 5 kicks in an hour, notify your doctor.   Complete by:  As directed    No sexual activity restrictions   Complete by:  As directed      Allergies as of 07/30/2018      Reactions   Avocado Swelling   Throat swelling   Trazodone And Nefazodone Anaphylaxis   Morphine And Related Nausea Only   Headaches      Medication List    TAKE these  medications   NIFEdipine 60 MG 24 hr tablet Commonly known as:  ADALAT CC Take 1 tablet (60 mg total) by mouth 2 (two) times daily. Start taking on:  July 31, 2018 What changed:    medication strength  See the new instructions.  Another medication with the same name was removed. Continue taking this medication, and follow the directions you see here.   pantoprazole 40 MG tablet Commonly known as:  PROTONIX Take 1 tablet (40 mg total) by mouth daily. Start taking on:  July 31, 2018   prenatal multivitamin Tabs tablet Take 1 tablet by mouth daily at 12 noon.      Follow-up Information    Maylynn Orzechowski, MD. Schedule an appointment as soon as possible for a visit in 3 day(s).   Specialty:  Obstetrics and Gynecology Contact information: [address], [address] [place] [address] [phone]           Signed: Leighton Roach Duvan Mousel 07/30/2018, 7:11 PM

## 2018-07-31 LAB — C3 COMPLEMENT: C3 COMPLEMENT: 139 mg/dL (ref 82–167)

## 2018-07-31 LAB — KAPPA/LAMBDA LIGHT CHAINS
KAPPA, LAMDA LIGHT CHAIN RATIO: 0.93 (ref 0.26–1.65)
Kappa free light chain: 24.2 mg/L — ABNORMAL HIGH (ref 3.3–19.4)
LAMDA FREE LIGHT CHAINS: 26.1 mg/L (ref 5.7–26.3)

## 2018-07-31 LAB — ANTI-DNA ANTIBODY, DOUBLE-STRANDED: ds DNA Ab: <1 IU/mL (ref 0–9)

## 2018-07-31 LAB — ANA W/REFLEX IF POSITIVE: Anti Nuclear Antibody(ANA): NEGATIVE

## 2018-07-31 LAB — C4 COMPLEMENT: COMPLEMENT C4, BODY FLUID: 19 mg/dL (ref 14–44)

## 2018-07-31 LAB — HEPATITIS C ANTIBODY: HCV Ab: <0.1 s/co ratio (ref 0.0–0.9)

## 2018-07-31 NOTE — Progress Notes (Addendum)
Chart reviewed.  Patient was discharged on 07/30/2018.  US renal unremarkable Lab serologies including ANA, ds DNA ab, k/l ratio, hep B, C, HIV and complements negative/unremarkable.  I recommend patient to follow with [facility], with Dr. Lenna Sciara.

## 2018-08-21 ENCOUNTER — Ambulatory Visit: Payer: Managed Care, Other (non HMO)

## 2018-08-28 ENCOUNTER — Ambulatory Visit: Payer: Managed Care, Other (non HMO) | Admitting: Registered"

## 2019-01-20 ENCOUNTER — Encounter (HOSPITAL_COMMUNITY): Payer: Self-pay

## 2019-08-06 ENCOUNTER — Other Ambulatory Visit (HOSPITAL_COMMUNITY): Payer: Self-pay | Admitting: Nephrology

## 2019-08-06 DIAGNOSIS — N049 Nephrotic syndrome with unspecified morphologic changes: Secondary | ICD-10-CM

## 2019-08-13 ENCOUNTER — Other Ambulatory Visit: Payer: Self-pay | Admitting: Radiology

## 2019-08-15 ENCOUNTER — Other Ambulatory Visit: Payer: Self-pay | Admitting: Radiology

## 2019-08-18 ENCOUNTER — Other Ambulatory Visit: Payer: Self-pay

## 2019-08-18 ENCOUNTER — Ambulatory Visit (HOSPITAL_COMMUNITY)
Admission: RE | Admit: 2019-08-18 | Discharge: 2019-08-18 | Disposition: A | Discharge status: Home / Self Care | Payer: Medicaid Other | Source: Ambulatory Visit | Attending: Nephrology | Admitting: Nephrology

## 2019-08-18 ENCOUNTER — Encounter (HOSPITAL_COMMUNITY): Payer: Self-pay

## 2019-08-18 DIAGNOSIS — Z885 Allergy status to narcotic agent status: Secondary | ICD-10-CM | POA: Diagnosis not present

## 2019-08-18 DIAGNOSIS — Z886 Allergy status to analgesic agent status: Secondary | ICD-10-CM | POA: Insufficient documentation

## 2019-08-18 DIAGNOSIS — Z79899 Other long term (current) drug therapy: Secondary | ICD-10-CM | POA: Insufficient documentation

## 2019-08-18 DIAGNOSIS — N189 Chronic kidney disease, unspecified: Secondary | ICD-10-CM | POA: Diagnosis not present

## 2019-08-18 DIAGNOSIS — J45909 Unspecified asthma, uncomplicated: Secondary | ICD-10-CM | POA: Insufficient documentation

## 2019-08-18 DIAGNOSIS — N049 Nephrotic syndrome with unspecified morphologic changes: Secondary | ICD-10-CM

## 2019-08-18 DIAGNOSIS — I129 Hypertensive chronic kidney disease with stage 1 through stage 4 chronic kidney disease, or unspecified chronic kidney disease: Secondary | ICD-10-CM | POA: Diagnosis not present

## 2019-08-18 DIAGNOSIS — Z888 Allergy status to other drugs, medicaments and biological substances status: Secondary | ICD-10-CM | POA: Diagnosis not present

## 2019-08-18 LAB — PROTIME-INR
INR: 0.9 (ref 0.8–1.2)
Prothrombin Time: 12.2 seconds (ref 11.4–15.2)

## 2019-08-18 LAB — CBC
HCT: 40.8 % (ref 36.0–46.0)
Hemoglobin: 13.2 g/dL (ref 12.0–15.0)
MCH: 28.3 pg (ref 26.0–34.0)
MCHC: 32.4 g/dL (ref 30.0–36.0)
MCV: 87.4 fL (ref 80.0–100.0)
Platelets: 236 10*3/uL (ref 150–400)
RBC: 4.67 MIL/uL (ref 3.87–5.11)
RDW: 13.3 % (ref 11.5–15.5)
WBC: 6.2 10*3/uL (ref 4.0–10.5)
nRBC: 0 % (ref 0.0–0.2)

## 2019-08-18 LAB — PREGNANCY, URINE: Preg Test, Ur: NEGATIVE

## 2019-08-18 MED ORDER — LIDOCAINE HCL (PF) 1 % IJ SOLN
INTRAMUSCULAR | Status: AC
  Filled 2019-08-18: qty 30

## 2019-08-18 MED ORDER — FENTANYL CITRATE (PF) 100 MCG/2ML IJ SOLN
INTRAMUSCULAR | Status: AC | PRN
  Administered 2019-08-18: 50 ug via INTRAVENOUS
  Administered 2019-08-18: 25 ug via INTRAVENOUS

## 2019-08-18 MED ORDER — FENTANYL CITRATE (PF) 100 MCG/2ML IJ SOLN
INTRAMUSCULAR | Status: AC
  Filled 2019-08-18: qty 2

## 2019-08-18 MED ORDER — MIDAZOLAM HCL 2 MG/2ML IJ SOLN
INTRAMUSCULAR | Status: AC | PRN
  Administered 2019-08-18 (×2): 0.5 mg via INTRAVENOUS
  Administered 2019-08-18: 1 mg via INTRAVENOUS

## 2019-08-18 MED ORDER — GELATIN ABSORBABLE 12-7 MM EX MISC
CUTANEOUS | Status: AC
  Filled 2019-08-18: qty 1

## 2019-08-18 MED ORDER — SODIUM CHLORIDE 0.9 % IV SOLN: INTRAVENOUS | Status: DC

## 2019-08-18 MED ORDER — MIDAZOLAM HCL 2 MG/2ML IJ SOLN
INTRAMUSCULAR | Status: AC
  Filled 2019-08-18: qty 2

## 2019-08-18 NOTE — Procedures (Signed)
Interventional Radiology Procedure Note  Procedure: US Guided Biopsy of left kidney  Complications: None  Estimated Blood Loss: < 10 mL  Findings: 16 G core biopsy of left kidney performed under US guidance.  Two core samples obtained and sent to Pathology.  Donella Pascarella T. Benen Weida, M.D Pager:  [phone]    

## 2019-08-18 NOTE — Discharge Instructions (Signed)
Percutaneous Kidney Biopsy, Care After This sheet gives you information about how to care for yourself after your procedure. Your health care provider may also give you more specific instructions. If you have problems or questions, contact your health care provider. What can I expect after the procedure? After the procedure, it is common to have:  Pain or soreness near the biopsy site.  Pink or cloudy urine for 24 hours after the procedure. Follow these instructions at home: Activity  Return to your normal activities as told by your health care provider. Ask your health care provider what activities are safe for you.  If you were given a sedative during the procedure, it can affect you for several hours. Do not drive or operate machinery until your health care provider says that it is safe.  Do not lift anything that is heavier than 10 lb (4.5 kg), or the limit that you are told, until your health care provider says that it is safe.  Avoid activities that take a lot of effort (are strenuous) until your health care provider approves. Most people will have to wait 2 weeks before returning to activities such as exercise or sex. General instructions   Take over-the-counter and prescription medicines only as told by your health care provider.  You may eat and drink after your procedure. Follow instructions from your health care provider about eating or drinking restrictions.  Check your biopsy site every day for signs of infection. Check for: ? More redness, swelling, or pain. ? Fluid or blood. ? Warmth. ? Pus or a bad smell.  Keep all follow-up visits as told by your health care provider. This is important. Contact a health care provider if:  You have more redness, swelling, or pain around your biopsy site.  You have fluid or blood coming from your biopsy site.  Your biopsy site feels warm to the touch.  You have pus or a bad smell coming from your biopsy site.  You have blood  in your urine more than 24 hours after your procedure.  You have a fever. Get help right away if:  Your urine is dark red or brown.  You cannot urinate.  It burns when you urinate.  You feel dizzy or light-headed.  You have severe pain in your abdomen or side. Summary  After the procedure, it is common to have pain or soreness at the biopsy site and pink or cloudy urine for the first 24 hours.  Check your biopsy site each day for signs of infection, such as more redness, swelling, or pain; fluid, blood, pus or a bad smell coming from the biopsy site; or the biopsy site feeling warm to touch.  Return to your normal activities as told by your health care provider. This information is not intended to replace advice given to you by your health care provider. Make sure you discuss any questions you have with your health care provider. Document Revised: 01/16/2019 Document Reviewed: 01/16/2019 Elsevier Patient Education  2020 [employer]  

## 2019-08-18 NOTE — H&P (Signed)
Chief Complaint: Patient was seen in consultation today for random renal biopsy at the request of Coladonato,Joseph  Referring Physician(s): Coladonato,Joseph  Supervising Physician: Irish Lack  Patient Status: [facility] - Out-pt  History of Present Illness: Cheryl Woods is a 34 y.o. female   Hx HTN Noted rise in HTN at [redacted] weeks gestation Preeclampsia C section at [facility] 08/30/19 and followed by Nephrology there Attempt at random renal bx 02/2019 Pt could not tolerate- unsuccessful bx  Now back to Dr Arrie Aran And now for re attempt at Random renal bx  Nephrotic syndrome; persistent proteinuria   Past Medical History:  Diagnosis Date  . Asthma   . Chronic hypertension     Past Surgical History:  Procedure Laterality Date  . DILATION AND CURETTAGE OF UTERUS    . WISDOM TOOTH EXTRACTION      Allergies: Avocado, Pork-derived products, Trazodone and nefazodone, Aspirin, Lisinopril, and Morphine and related  Medications: Prior to Admission medications   Medication Sig Start Date End Date Taking? Authorizing Provider  acetaminophen (TYLENOL) 325 MG tablet Take 650 mg by mouth every 6 (six) hours as needed for moderate pain or headache.   Yes [provider]  albuterol (VENTOLIN HFA) 108 (90 Base) MCG/ACT inhaler Inhale 2 puffs into the lungs every 6 (six) hours as needed for wheezing or shortness of breath.   Yes [provider]  cetirizine (ZYRTEC) 10 MG tablet Take 10 mg by mouth daily as needed for allergies.   Yes [provider]  hydrOXYzine (ATARAX/VISTARIL) 25 MG tablet Take 25 mg by mouth at bedtime as needed for anxiety (sleep).    Yes [provider]  Multiple Vitamins-Minerals (PRORENAL + D) TABS Take 2 tablets by mouth daily.   Yes [provider]  NIFEdipine (ADALAT CC) 60 MG 24 hr tablet Take 1 tablet (60 mg total) by mouth 2 (two) times daily. Patient not taking: Reported on 08/12/2019 07/31/18   Meisinger, Tawanna Cooler,  MD  pantoprazole (PROTONIX) 40 MG tablet Take 1 tablet (40 mg total) by mouth daily. Patient not taking: Reported on 08/12/2019 07/31/18   Lavina Hamman, MD     History reviewed. No pertinent family history.  Social History   Socioeconomic History  . Marital status: Married    Spouse name: Not on file  . Number of children: Not on file  . Years of education: Not on file  . Highest education level: Not on file  Occupational History  . Not on file  Tobacco Use  . Smoking status: Never Smoker  . Smokeless tobacco: Never Used  Substance and Sexual Activity  . Alcohol use: Not Currently  . Drug use: Never  . Sexual activity: Yes    Birth control/protection: None  Other Topics Concern  . Not on file  Social History Narrative  . Not on file   Social Determinants of Health   Financial Resource Strain:   . Difficulty of Paying Living Expenses:   Food Insecurity:   . Worried About Programme researcher, broadcasting/film/video in the Last Year:   . Barista in the Last Year:   Transportation Needs:   . Freight forwarder (Medical):   Marland Kitchen Lack of Transportation (Non-Medical):   Physical Activity:   . Days of Exercise per Week:   . Minutes of Exercise per Session:   Stress:   . Feeling of Stress :   Social Connections:   . Frequency of Communication with Friends and Family:   . Frequency of  Social Gatherings with Friends and Family:   . Attends Religious Services:   . Active Member of Clubs or Organizations:   . Attends Archivist Meetings:   Marland Kitchen Marital Status:    Review of Systems: A 12 point ROS discussed and pertinent positives are indicated in the HPI above.  All other systems are negative.  Review of Systems  Constitutional: Negative for activity change, appetite change and fever.  Respiratory: Negative for cough and shortness of breath.   Cardiovascular: Negative for chest pain.  Gastrointestinal: Negative for abdominal pain.  Musculoskeletal: Negative for back pain.    Neurological: Negative for weakness.  Psychiatric/Behavioral: Negative for behavioral problems and confusion.    Vital Signs: BP 124/85   Pulse 87   Temp 98.1 F (36.7 C) (Oral)   Resp 18   Ht 5\' 4"  (1.626 m)   Wt 168 lb (76.2 kg) Comment: 168lb  SpO2 100%   BMI 28.84 kg/m   Physical Exam Vitals reviewed.  Cardiovascular:     Rate and Rhythm: Normal rate and regular rhythm.     Heart sounds: Normal heart sounds.  Pulmonary:     Effort: Pulmonary effort is normal.     Breath sounds: Normal breath sounds.  Abdominal:     Palpations: Abdomen is soft.  Musculoskeletal:        General: Normal range of motion.  Skin:    General: Skin is warm and dry.  Neurological:     Mental Status: She is alert and oriented to person, place, and time.  Psychiatric:        Behavior: Behavior normal.        Thought Content: Thought content normal.        Judgment: Judgment normal.     Imaging: No results found.  Labs:  CBC: Recent Labs    08/18/19 0640  WBC 6.2  HGB 13.2  HCT 40.8  PLT 236    COAGS: No results for input(s): INR, APTT in the last 8760 hours.  BMP: No results for input(s): NA, K, CL, CO2, GLUCOSE, BUN, CALCIUM, CREATININE, GFRNONAA, GFRAA in the last 8760 hours.  Invalid input(s): CMP  LIVER FUNCTION TESTS: No results for input(s): BILITOT, AST, ALT, ALKPHOS, PROT, ALBUMIN in the last 8760 hours.  TUMOR MARKERS: No results for input(s): AFPTM, CEA, CA199, CHROMGRNA in the last 8760 hours.  Assessment and Plan:  Nephrotic syndrome Proteinuria Scheduled for random renal biopsy Risks and benefits of random renal bx was discussed with the patient and/or patient's family including, but not limited to bleeding, infection, damage to adjacent structures or low yield requiring additional tests.  All of the questions were answered and there is agreement to proceed. Consent signed and in chart.   Thank you for this interesting consult.  I greatly enjoyed  meeting Cheryl Woods and look forward to participating in their care.  A copy of this report was sent to the requesting provider on this date.  Electronically Signed: Lavonia Drafts, PA-C 08/18/2019, 7:10 AM   I spent a total of  30 Minutes   in face to face in clinical consultation, greater than 50% of which was counseling/coordinating care for random renal bx

## 2019-08-27 ENCOUNTER — Encounter (HOSPITAL_COMMUNITY): Payer: Self-pay | Admitting: Nephrology

## 2019-08-27 LAB — SURGICAL PATHOLOGY

## 2020-06-12 ENCOUNTER — Other Ambulatory Visit: Payer: Self-pay

## 2020-06-12 ENCOUNTER — Ambulatory Visit
Admission: EM | Admit: 2020-06-12 | Discharge: 2020-06-12 | Disposition: A | Discharge status: Home / Self Care | Payer: Managed Care, Other (non HMO) | Attending: Emergency Medicine | Admitting: Emergency Medicine

## 2020-06-12 DIAGNOSIS — J069 Acute upper respiratory infection, unspecified: Secondary | ICD-10-CM

## 2020-06-12 DIAGNOSIS — R062 Wheezing: Secondary | ICD-10-CM | POA: Diagnosis not present

## 2020-06-12 DIAGNOSIS — Z1152 Encounter for screening for COVID-19: Secondary | ICD-10-CM

## 2020-06-12 HISTORY — DX: Pure hypercholesterolemia, unspecified: E78.00

## 2020-06-12 HISTORY — DX: Disorder of kidney and ureter, unspecified: N28.9

## 2020-06-12 MED ORDER — DEXAMETHASONE 4 MG PO TABS: 4.0000 mg | ORAL_TABLET | Freq: Every day | ORAL | 0 refills | Status: AC

## 2020-06-12 MED ORDER — CETIRIZINE HCL 10 MG PO TABS: 10.0000 mg | ORAL_TABLET | Freq: Every day | ORAL | 0 refills | Status: AC

## 2020-06-12 MED ORDER — ALBUTEROL SULFATE HFA 108 (90 BASE) MCG/ACT IN AERS: 1.0000 | INHALATION_SPRAY | Freq: Four times a day (QID) | RESPIRATORY_TRACT | 0 refills | Status: AC | PRN

## 2020-06-12 MED ORDER — BENZONATATE 100 MG PO CAPS: 100.0000 mg | ORAL_CAPSULE | Freq: Three times a day (TID) | ORAL | 0 refills | Status: AC | PRN

## 2020-06-12 NOTE — ED Provider Notes (Signed)
[facility]   [id number] 06/12/20 Arrival Time: 1321   CC: COVID symptoms  SUBJECTIVE: History from: patient.  Cheryl Woods is a 35 y.o. female who presented to the urgent care for complaint of chills, fever, nasal congestion, cough and green sputum tested this morning.  He has has been with the same URI symptoms.  Denies recent travel.  Has tried OTC medication without relief.  Denies alleviating or aggravating factors.  Denies previous symptoms in the past.   Denies  fatigue, sinus pain, rhinorrhea, sore throat, SOB, wheezing, chest pain, nausea, changes in bowel or bladder habits.     ROS: As per HPI.  All other pertinent ROS negative.     Past Medical History:  Diagnosis Date  . Asthma   . Chronic hypertension   . High cholesterol   . Renal disorder    stage 3 b kidney disease    Past Surgical History:  Procedure Laterality Date  . CESAREAN SECTION    . DILATION AND CURETTAGE OF UTERUS    . WISDOM TOOTH EXTRACTION     Allergies  Allergen Reactions  . Avocado Swelling    Throat swelling   . Pork-Derived Products Anaphylaxis  . Trazodone And Nefazodone Anaphylaxis  . Aspirin Nausea And Vomiting  . Lisinopril Cough  . Morphine And Related Nausea Only    Headaches    No current facility-administered medications on file prior to encounter.   Current Outpatient Medications on File Prior to Encounter  Medication Sig Dispense Refill  . acetaminophen (TYLENOL) 325 MG tablet Take 650 mg by mouth every 6 (six) hours as needed for moderate pain or headache.    . hydrOXYzine (ATARAX/VISTARIL) 25 MG tablet Take 25 mg by mouth at bedtime as needed for anxiety (sleep).     . Multiple Vitamins-Minerals (PRORENAL + D) TABS Take 2 tablets by mouth daily.    Marland Kitchen NIFEdipine (ADALAT CC) 60 MG 24 hr tablet Take 1 tablet (60 mg total) by mouth 2 (two) times daily. (Patient not taking: Reported on 08/12/2019) 60 tablet 3  . pantoprazole (PROTONIX) 40 MG tablet Take 1 tablet (40  mg total) by mouth daily. (Patient not taking: Reported on 08/12/2019) 30 tablet 3   Social History   Socioeconomic History  . Marital status: Married    Spouse name: Not on file  . Number of children: Not on file  . Years of education: Not on file  . Highest education level: Not on file  Occupational History  . Not on file  Tobacco Use  . Smoking status: Never Smoker  . Smokeless tobacco: Never Used  Vaping Use  . Vaping Use: Never used  Substance and Sexual Activity  . Alcohol use: Not Currently  . Drug use: Never  . Sexual activity: Yes    Birth control/protection: None  Other Topics Concern  . Not on file  Social History Narrative  . Not on file   Social Determinants of Health   Financial Resource Strain: Not on file  Food Insecurity: Not on file  Transportation Needs: Not on file  Physical Activity: Not on file  Stress: Not on file  Social Connections: Not on file  Intimate Partner Violence: Not on file   No family history on file.  OBJECTIVE:  Vitals:   06/12/20 1332 06/12/20 1335  BP:  (!) 149/99  Pulse:  (!) 104  Resp:  19  Temp:  99.6 F (37.6 C)  TempSrc:  Oral  SpO2:  98%  Weight:  172 lb (78 kg)   Height: 5\' 3"  (1.6 m)      General appearance: alert; appears fatigued, but nontoxic; speaking in full sentences and tolerating own secretions HEENT: NCAT; Ears: EACs clear, TMs pearly gray; Eyes: PERRL.  EOM grossly intact. Sinuses: nontender; Nose: nares patent without rhinorrhea, Throat: oropharynx clear, tonsils non erythematous or enlarged, uvula midline  Neck: supple without LAD Lungs: unlabored respirations, symmetrical air entry; cough: moderate; no respiratory distress; CTAB Heart: regular rate and rhythm.  Radial pulses 2+ symmetrical bilaterally Skin: warm and dry Psychological: alert and cooperative; normal mood and affect  LABS:  No results found for this or any previous visit (from the past 24 hour(s)).   ASSESSMENT & PLAN:  1.  Encounter for screening for COVID-19   2. URI with cough and congestion   3. Wheezing     Meds ordered this encounter  Medications  . albuterol (VENTOLIN HFA) 108 (90 Base) MCG/ACT inhaler    Sig: Inhale 1-2 puffs into the lungs every 6 (six) hours as needed for wheezing or shortness of breath.    Dispense:  18 g    Refill:  0  . cetirizine (ZYRTEC ALLERGY) 10 MG tablet    Sig: Take 1 tablet (10 mg total) by mouth daily.    Dispense:  30 tablet    Refill:  0  . dexamethasone (DECADRON) 4 MG tablet    Sig: Take 1 tablet (4 mg total) by mouth daily for 7 days.    Dispense:  7 tablet    Refill:  0  . benzonatate (TESSALON) 100 MG capsule    Sig: Take 1 capsule (100 mg total) by mouth 3 (three) times daily as needed for cough.    Dispense:  30 capsule    Refill:  0   Discharge Instructions  COVID testing ordered.  It will take between 2-7 days for test results.  Someone will contact you regarding abnormal results.    Get plenty of rest and push fluids Tessalon Perles prescribed for cough zyrtec for nasal congestion, runny nose, and/or sore throat Albuterol as prescribed Decadron was prescribed Use medications daily for symptom relief Use OTC medications like ibuprofen or tylenol as needed fever or pain Call or go to the ED if you have any new or worsening symptoms such as fever, worsening cough, shortness of breath, chest tightness, chest pain, turning blue, changes in mental status, etc...   Reviewed expectations re: course of current medical issues. Questions answered. Outlined signs and symptoms indicating need for more acute intervention. Patient verbalized understanding. After Visit Summary given.         , FNP 06/12/20 1348

## 2020-06-12 NOTE — ED Triage Notes (Signed)
Runny nose, fever, coughing,  spitting up green mucous in the morning.

## 2020-06-12 NOTE — Discharge Instructions (Signed)
COVID testing ordered.  It will take between 2-7 days for test results.  Someone will contact you regarding abnormal results.    Get plenty of rest and push fluids Tessalon Perles prescribed for cough zyrtec for nasal congestion, runny nose, and/or sore throat Albuterol as prescribed Decadron was prescribed Use medications daily for symptom relief Use OTC medications like ibuprofen or tylenol as needed fever or pain Call or go to the ED if you have any new or worsening symptoms such as fever, worsening cough, shortness of breath, chest tightness, chest pain, turning blue, changes in mental status, etc..Marland Kitchen

## 2020-06-16 LAB — COVID-19, FLU A+B NAA
Influenza A, NAA: NOT DETECTED
Influenza B, NAA: NOT DETECTED
SARS-CoV-2, NAA: DETECTED — AB

## 2020-07-10 IMAGING — US US RENAL
1 series · 15 of 25 positions shown · non-contrast
Comparison: None.

CLINICAL DATA: Proteinuria.

EXAM:
RENAL / URINARY TRACT ULTRASOUND COMPLETE

[Series 1: us renal · 15 of 35 slices shown]
[im 1/35]
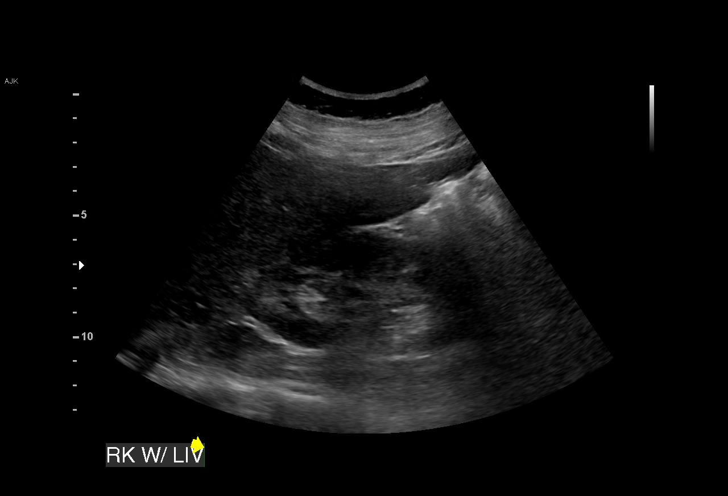
[im 3/35]
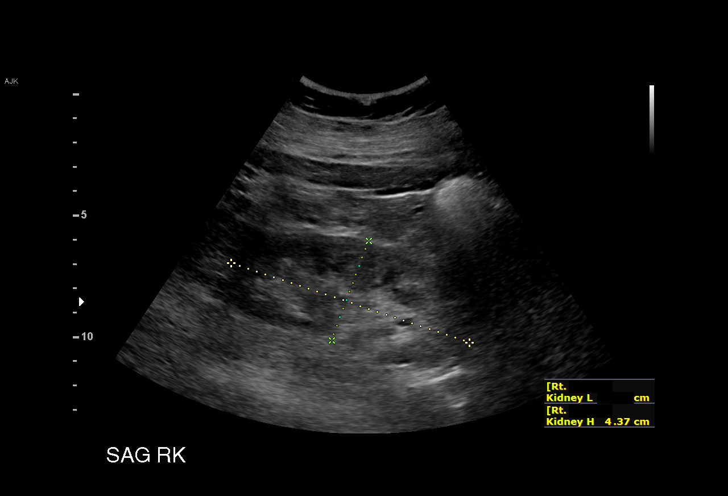
[im 6/35]
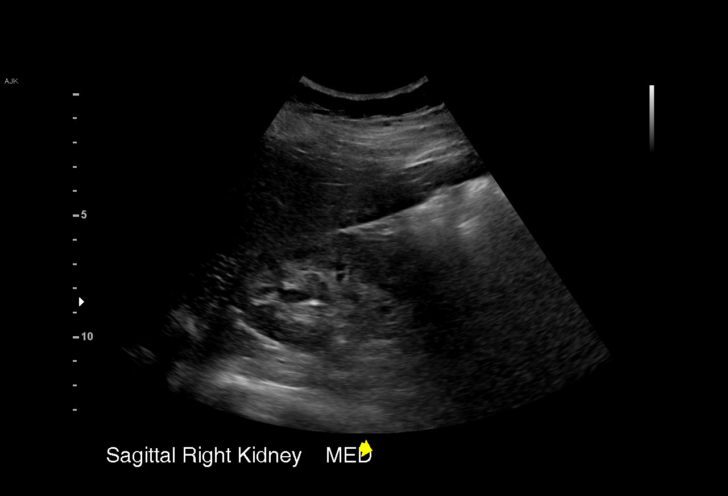
[im 8/35]
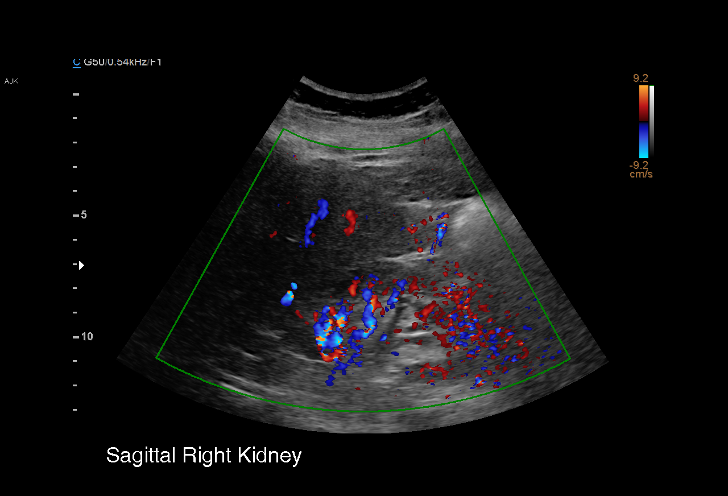
[im 10/35]
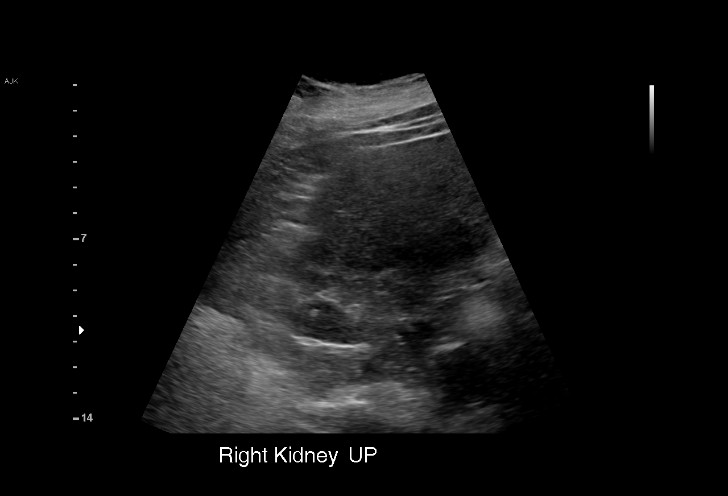
[im 13/35]
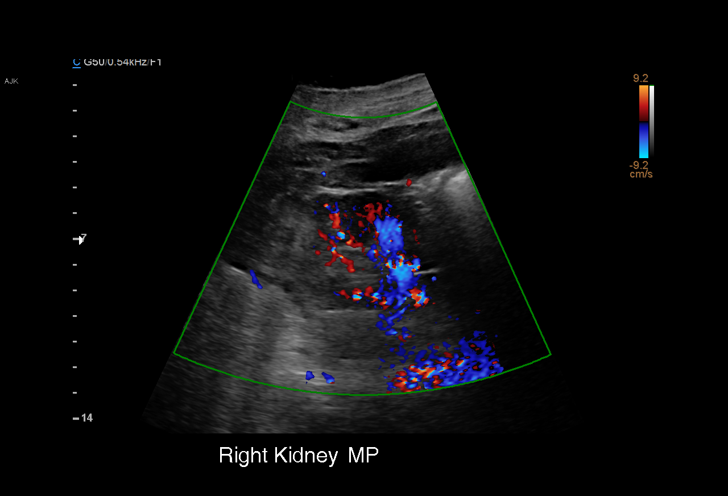
[im 15/35]
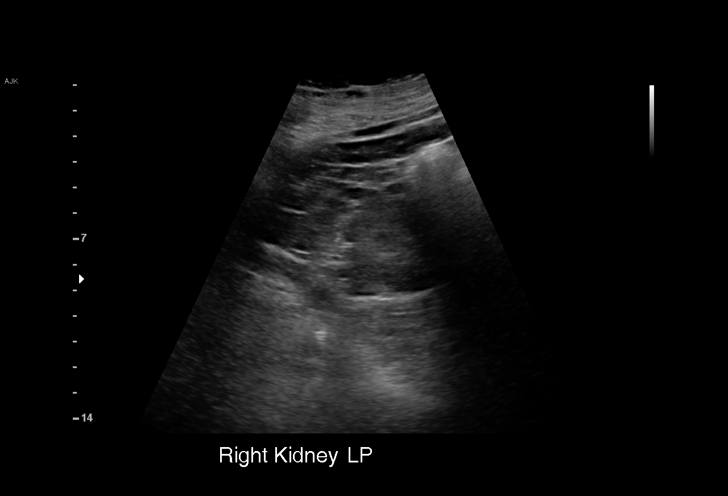
[im 18/35]
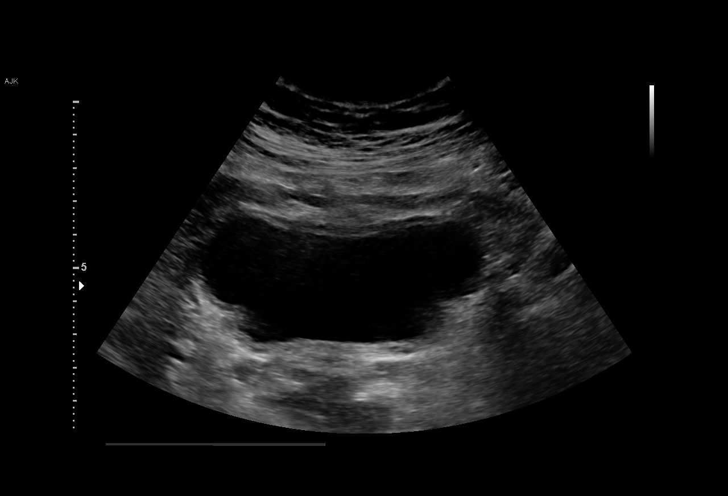
[im 20/35]
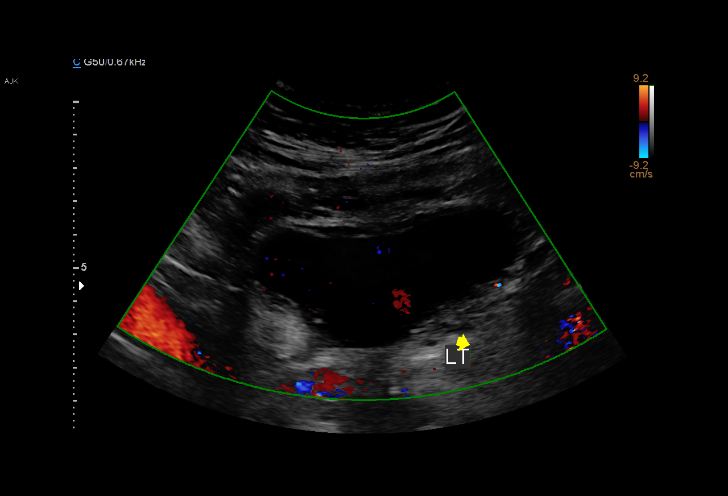
[im 22/35]
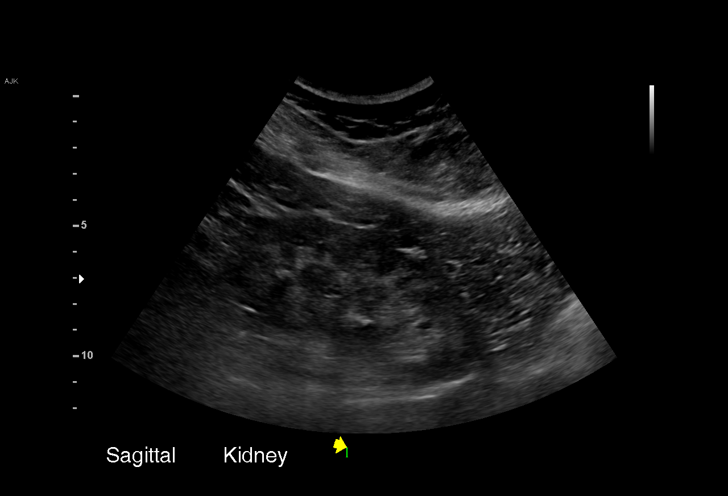
[im 25/35]
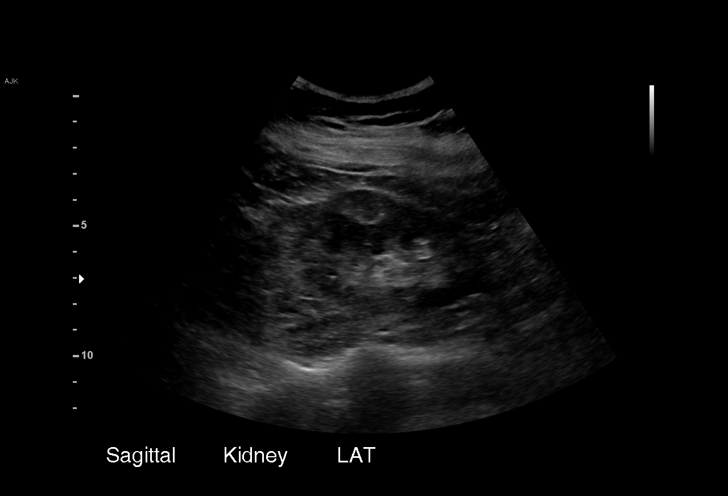
[im 27/35]
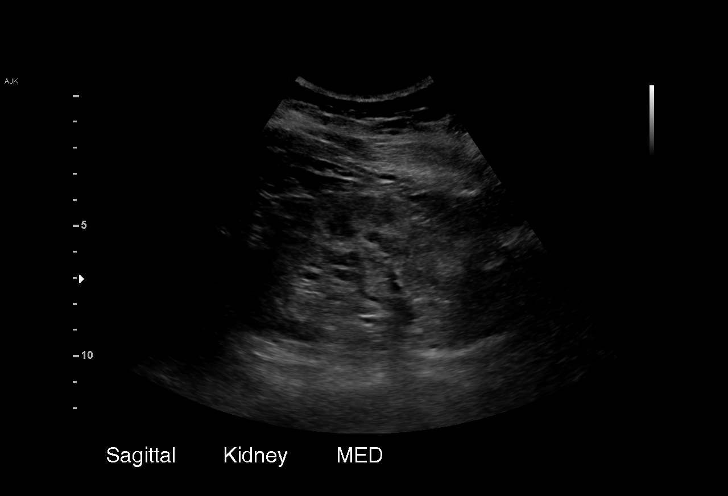
[im 29/35]
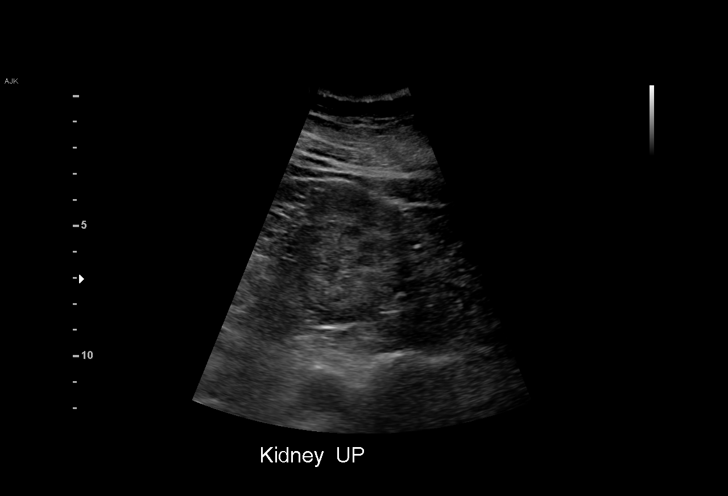
[im 32/35]
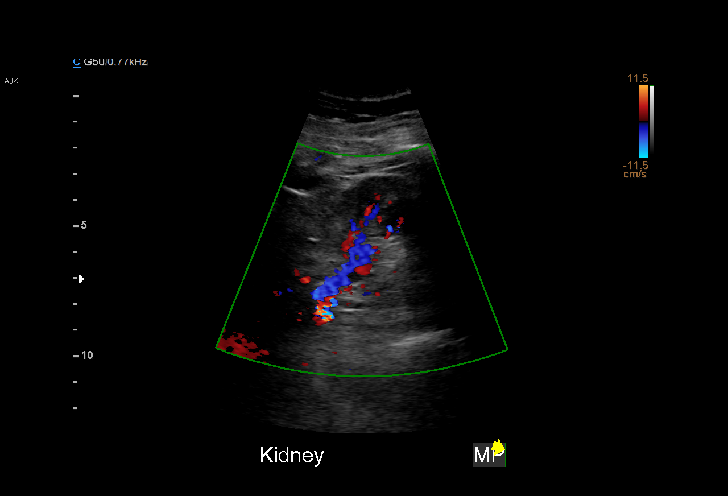
[im 35/35]
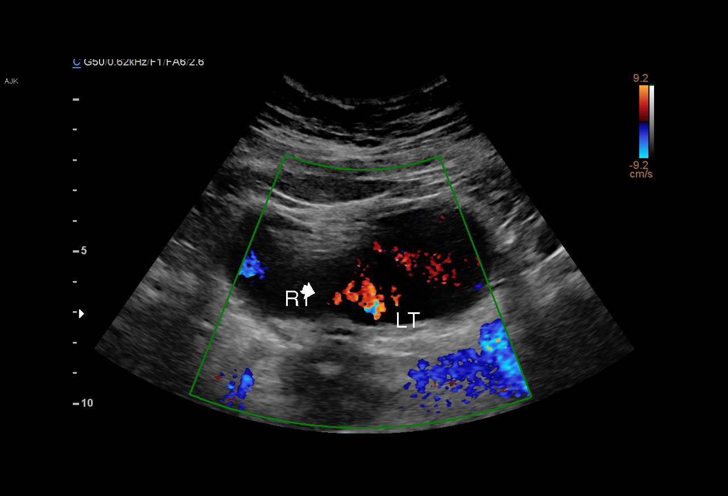

[15 of 25 positions shown; findings below may reference images not displayed]

FINDINGS: Right Kidney:

Renal measurements: 10.3 x 4.4 x 6.7 cm = volume: 159 mL .
Echogenicity within normal limits. No mass or hydronephrosis
visualized.

Left Kidney:

Renal measurements: 10.5 x 4.3 x 5.5 cm = volume: 131 mL.
Echogenicity within normal limits. No mass or hydronephrosis
visualized.

Bladder:

Appears normal for degree of bladder distention.
IMPRESSION: No significant abnormality to explain the patient's symptoms.

## 2021-02-07 ENCOUNTER — Other Ambulatory Visit: Payer: Self-pay

## 2021-02-07 ENCOUNTER — Ambulatory Visit
Admission: EM | Admit: 2021-02-07 | Discharge: 2021-02-07 | Disposition: A | Discharge status: Home / Self Care | Payer: Managed Care, Other (non HMO) | Attending: Family | Admitting: Family

## 2021-02-07 ENCOUNTER — Encounter: Payer: Self-pay | Admitting: Emergency Medicine

## 2021-02-07 DIAGNOSIS — R309 Painful micturition, unspecified: Secondary | ICD-10-CM | POA: Insufficient documentation

## 2021-02-07 DIAGNOSIS — R109 Unspecified abdominal pain: Secondary | ICD-10-CM | POA: Insufficient documentation

## 2021-02-07 DIAGNOSIS — R3129 Other microscopic hematuria: Secondary | ICD-10-CM | POA: Diagnosis present

## 2021-02-07 LAB — POCT URINALYSIS DIP (MANUAL ENTRY)
Bilirubin, UA: NEGATIVE
Glucose, UA: NEGATIVE mg/dL
Ketones, POC UA: NEGATIVE mg/dL
Leukocytes, UA: NEGATIVE
Nitrite, UA: NEGATIVE
Protein Ur, POC: 100 mg/dL — AB
Spec Grav, UA: <=1.005 — AB (ref 1.010–1.025)
Urobilinogen, UA: 0.2 E.U./dL
pH, UA: 5.5 (ref 5.0–8.0)

## 2021-02-07 MED ORDER — NITROFURANTOIN MONOHYD MACRO 100 MG PO CAPS: 100.0000 mg | ORAL_CAPSULE | Freq: Two times a day (BID) | ORAL | 0 refills | Status: AC

## 2021-02-07 NOTE — ED Triage Notes (Signed)
Low back pain x 2 days.  Pt believes she has UTI

## 2021-02-07 NOTE — Discharge Instructions (Addendum)
Recommend start Macrobid 100mg  twice a day as directed. Continue to push fluids. May continue Tylenol 1000mg  every 8 hours as needed for pain. Continue to push fluids. If pain does not improve or gets worse, go to the ER ASAP. Otherwise, follow-up pending urine culture results.

## 2021-02-07 NOTE — ED Provider Notes (Signed)
[facility]    CSN: [id number] Arrival date & time: 02/07/21  0803      History   Chief Complaint Chief Complaint  Patient presents with   Back Pain    HPI Cheryl Woods is a 35 y.o. female.   35 year old female presents with right sided lower back and flank pain that started about 2 days ago. Pain increases with urination and she is experiencing some dysuria. Slight nausea. Denies any fever or hematuria. No vomiting or distinct abdominal pain. No unusual vaginal discharge. Has been taking Tylenol with some relief. Other chronic health issues include HTN, hyperlipidemia, insomnia, anxiety and asthma.  Also decreased kidney function and proteinuria in the past. Currently on Losartan, Atorvastatin, Zyrtec daily and Albuterol and Atarax prn.   The history is provided by the patient.   Past Medical History:  Diagnosis Date   Asthma    Chronic hypertension    High cholesterol    Renal disorder    stage 3 b kidney disease     Patient Active Problem List   Diagnosis Date Noted   Chronic hypertension complicating or reason for care during pregnancy, second trimester 07/25/2018    Past Surgical History:  Procedure Laterality Date   CESAREAN SECTION     DILATION AND CURETTAGE OF UTERUS     WISDOM TOOTH EXTRACTION      OB History     Gravida  3   Para  1   Term  1   Preterm      AB  1   Living  1      SAB  1   IAB      Ectopic      Multiple      Live Births  1            Home Medications    Prior to Admission medications   Medication Sig Start Date End Date Taking? Authorizing Provider  losartan (COZAAR) 25 MG tablet Take 25 mg by mouth daily.   Yes [provider]  nitrofurantoin, macrocrystal-monohydrate, (MACROBID) 100 MG capsule Take 1 capsule (100 mg total) by mouth 2 (two) times daily for 5 days. 02/07/21 02/12/21 Yes Loredana Medellin, Ali Lowe, NP  acetaminophen (TYLENOL) 325 MG tablet Take 650 mg by mouth every 6 (six) hours as  needed for moderate pain or headache.    [provider]  albuterol (VENTOLIN HFA) 108 (90 Base) MCG/ACT inhaler Inhale 1-2 puffs into the lungs every 6 (six) hours as needed for wheezing or shortness of breath. 06/12/20   Avegno, Zachery Dakins, FNP  benzonatate (TESSALON) 100 MG capsule Take 1 capsule (100 mg total) by mouth 3 (three) times daily as needed for cough. 06/12/20   Avegno, Zachery Dakins, FNP  cetirizine (ZYRTEC ALLERGY) 10 MG tablet Take 1 tablet (10 mg total) by mouth daily. 06/12/20   Avegno, Zachery Dakins, FNP  hydrOXYzine (ATARAX/VISTARIL) 25 MG tablet Take 25 mg by mouth at bedtime as needed for anxiety (sleep).     [provider]  Multiple Vitamins-Minerals (PRORENAL + D) TABS Take 2 tablets by mouth daily.    [provider]  pantoprazole (PROTONIX) 40 MG tablet Take 1 tablet (40 mg total) by mouth daily. Patient not taking: Reported on 08/12/2019 07/31/18   Lavina Hamman, MD    Family History No family history on file.  Social History Social History   Tobacco Use   Smoking status: Never   Smokeless tobacco: Never  Vaping  Use   Vaping Use: Never used  Substance Use Topics   Alcohol use: Not Currently   Drug use: Never     Allergies   Avocado, Pork-derived products, Trazodone and nefazodone, Aspirin, Lisinopril, and Morphine and related   Review of Systems Review of Systems  Constitutional:  Positive for fatigue. Negative for activity change, appetite change, chills and fever.  Respiratory:  Negative for chest tightness, shortness of breath and wheezing.   Gastrointestinal:  Positive for nausea. Negative for abdominal pain and vomiting.  Genitourinary:  Positive for decreased urine volume, dysuria, flank pain, frequency and urgency. Negative for difficulty urinating, genital sores, hematuria, pelvic pain and vaginal discharge.  Musculoskeletal:  Positive for back pain. Negative for arthralgias and myalgias.  Skin:  Negative for color change and  rash.  Allergic/Immunologic: Positive for environmental allergies and food allergies. Negative for immunocompromised state.  Neurological:  Negative for dizziness, tremors, seizures, syncope, weakness, light-headedness, numbness and headaches.  Hematological:  Negative for adenopathy. Does not bruise/bleed easily.    Physical Exam Triage Vital Signs ED Triage Vitals [02/07/21 0815]  Enc Vitals Group     BP (!) 149/97     Pulse Rate 81     Resp 17     Temp (!) 97.5 F (36.4 C)     Temp Source Oral     SpO2 98 %     Weight      Height      Head Circumference      Peak Flow      Pain Score 7     Pain Loc      Pain Edu?      Excl. in GC?    No data found.  Updated Vital Signs BP (!) 149/97 (BP Location: Right Arm)   Pulse 81   Temp (!) 97.5 F (36.4 C) (Oral)   Resp 17   LMP 02/01/2021   SpO2 98%   Visual Acuity Right Eye Distance:   Left Eye Distance:   Bilateral Distance:    Right Eye Near:   Left Eye Near:    Bilateral Near:     Physical Exam Vitals and nursing note reviewed.  Constitutional:      General: She is awake. She is not in acute distress.    Appearance: She is well-developed and well-groomed.     Comments: She is sitting on the exam table in no acute distress but appears uncomfortable due to flank pain.   HENT:     Head: Normocephalic and atraumatic.     Right Ear: Hearing normal.     Left Ear: Hearing normal.  Eyes:     Extraocular Movements: Extraocular movements intact.     Conjunctiva/sclera: Conjunctivae normal.  Cardiovascular:     Rate and Rhythm: Normal rate and regular rhythm.     Heart sounds: Normal heart sounds. No murmur heard. Pulmonary:     Effort: Pulmonary effort is normal. No respiratory distress.     Breath sounds: Normal breath sounds and air entry. No decreased air movement. No decreased breath sounds, wheezing, rhonchi or rales.  Abdominal:     General: Abdomen is flat.     Palpations: Abdomen is soft.     Tenderness:  There is right CVA tenderness and left CVA tenderness. There is no guarding or rebound.     Comments: Right more than left CVA tenderness.   Musculoskeletal:     Cervical back: Normal range of motion.  Skin:    General:  Skin is warm and dry.     Findings: No rash.  Neurological:     General: No focal deficit present.     Mental Status: She is alert and oriented to person, place, and time.  Psychiatric:        Mood and Affect: Mood normal.        Behavior: Behavior normal. Behavior is cooperative.        Thought Content: Thought content normal.        Judgment: Judgment normal.     UC Treatments / Results  Labs (all labs ordered are listed, but only abnormal results are displayed) Labs Reviewed  POCT URINALYSIS DIP (MANUAL ENTRY) - Abnormal; Notable for the following components:      Result Value   Spec Grav, UA <=1.005 (*)    Blood, UA trace-intact (*)    Protein Ur, POC =100 (*)    All other components within normal limits  URINE CULTURE    EKG   Radiology No results found.  Procedures Procedures (including critical care time)  Medications Ordered in UC Medications - No data to display  Initial Impression / Assessment and Plan / UC Course  I have reviewed the triage vital signs and the nursing notes.  Pertinent labs & imaging results that were available during my care of the patient were reviewed by me and considered in my medical decision making (see chart for details).    Reviewed urinalysis results with patient- positive blood and protein. No WBC's but specimen very diluted. Possible UTI/early pyelonephritis with right flank pain. Also discussed possibility of renal calculi. Will send urine for culture. Will start Macrobid 100mg  twice a day as directed. Continue to push fluids. May continue Tylenol 1000mg  every 8 hours as needed for pain. No NSAIDS. After patient had left, reviewed previous labs and notes from PCP and Nephrology visits in 2020. History of  proteinuria with decreased kidney function. Proteinuria continued with visits in 2021. No recent labs in 2022. Had emphasized before she left Urgent Care that if pain does not improve or gets worse within 24-48 hours, go to the ER ASAP for further evaluation. May need to see Nephrologist again. Follow-up pending urine culture results.    Final Clinical Impressions(s) / UC Diagnoses   Final diagnoses:  Acute flank pain  Pain with urination  Other microscopic hematuria     Discharge Instructions      Recommend start Macrobid 100mg  twice a day as directed. Continue to push fluids. May continue Tylenol 1000mg  every 8 hours as needed for pain. Continue to push fluids. If pain does not improve or gets worse, go to the ER ASAP. Otherwise, follow-up pending urine culture results.      ED Prescriptions     Medication Sig Dispense Auth. Provider   nitrofurantoin, macrocrystal-monohydrate, (MACROBID) 100 MG capsule Take 1 capsule (100 mg total) by mouth 2 (two) times daily for 5 days. 10 capsule Jazaria Jarecki, 2023, NP      PDMP not reviewed this encounter.   04-16-1982, NP 02/08/21 1044

## 2021-02-09 LAB — URINE CULTURE: Culture: <10000 — AB

## 2021-07-30 IMAGING — US US BIOPSY
1 series · 10 of 10 positions shown · non-contrast
Comparison: none

INDICATION: Nephrotic syndrome and proteinuria. Need for random renal biopsy for
further evaluation.

[Series 1: us biopsy (kidney) · 10 of 10 slices shown]
[im 1/10]
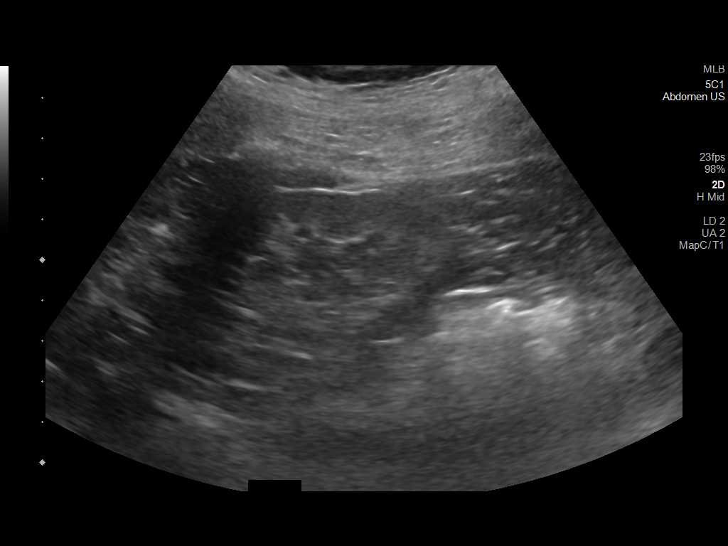
[im 2/10]
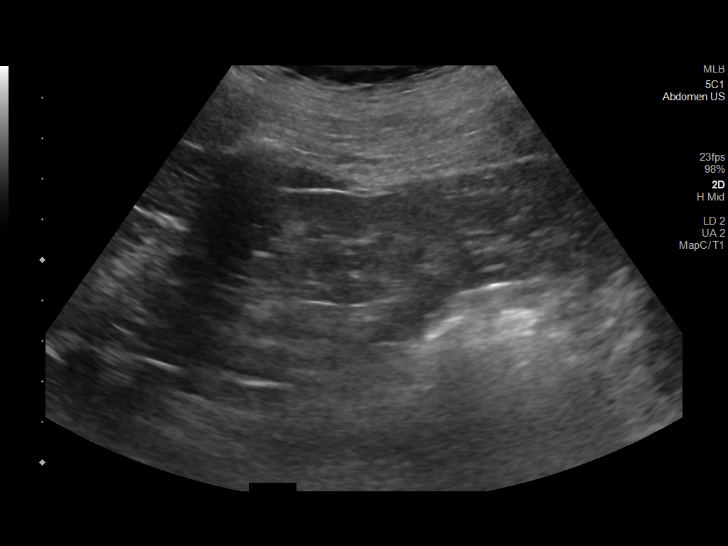
[im 3/10]
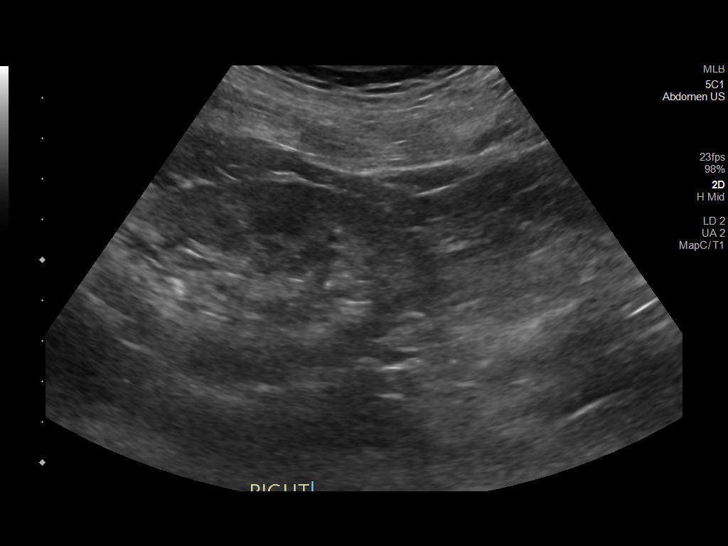
[im 4/10]
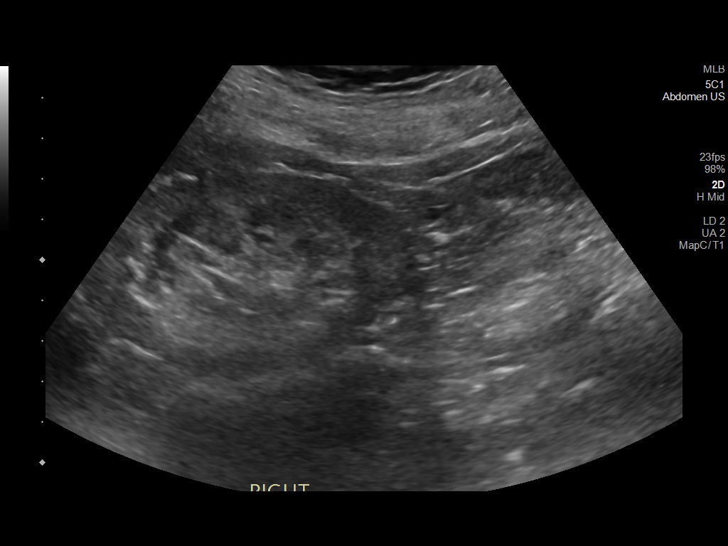
[im 5/10]
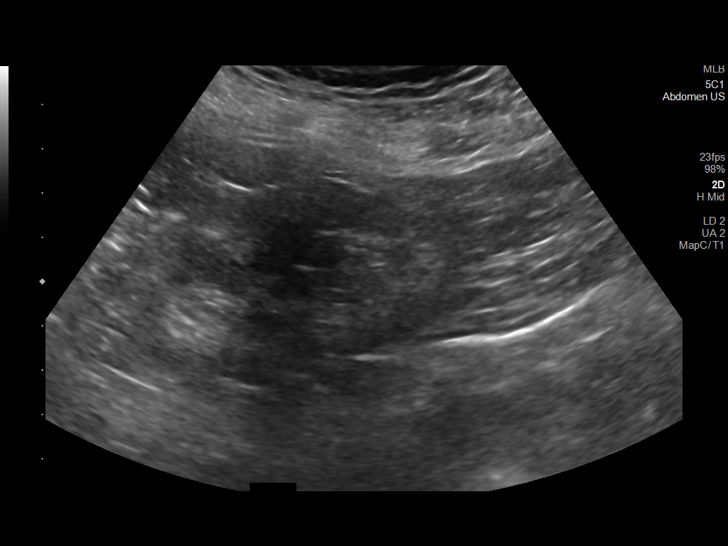
[im 6/10]
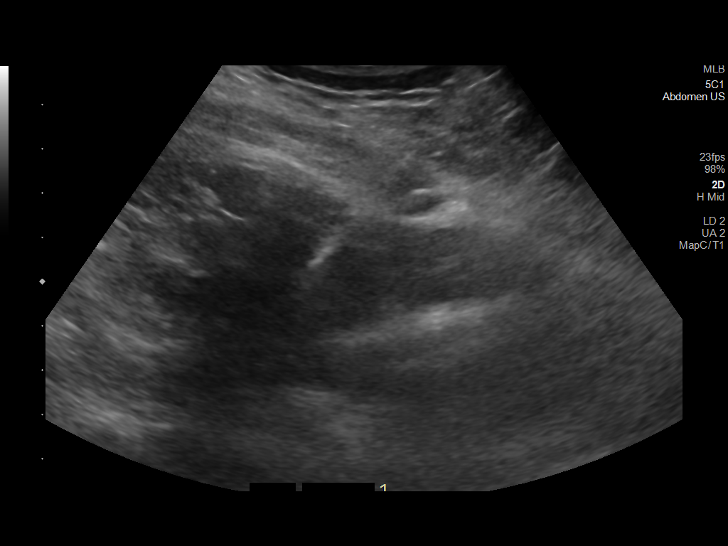
[im 7/10]
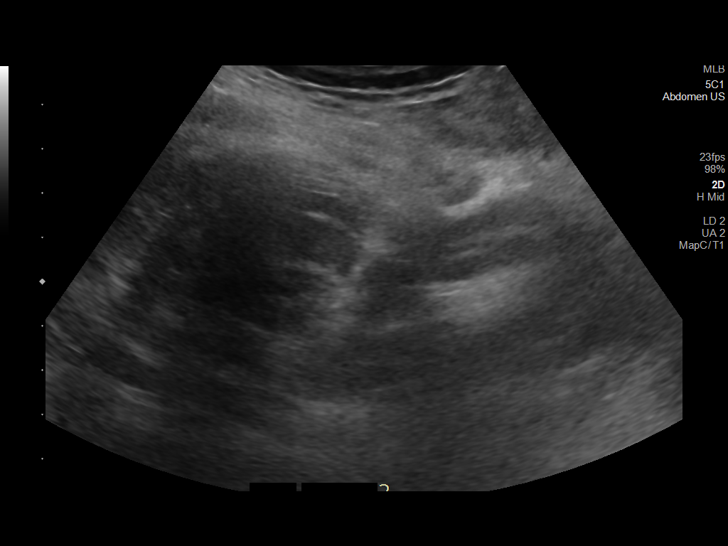
[im 8/10]
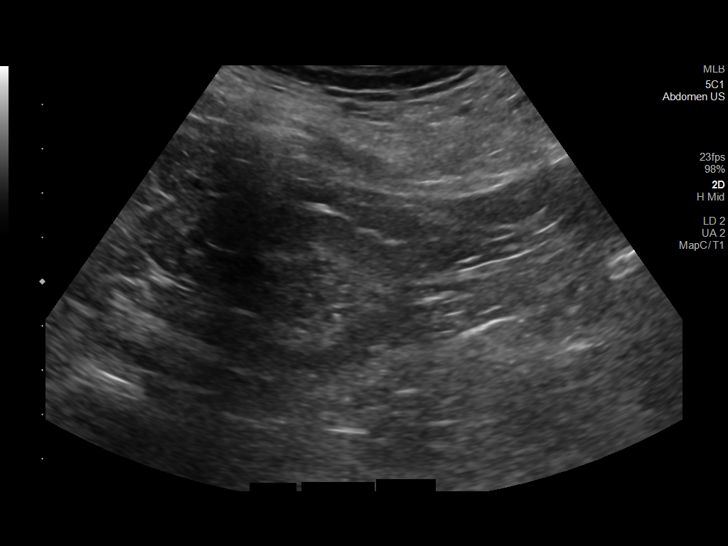
[im 9/10]
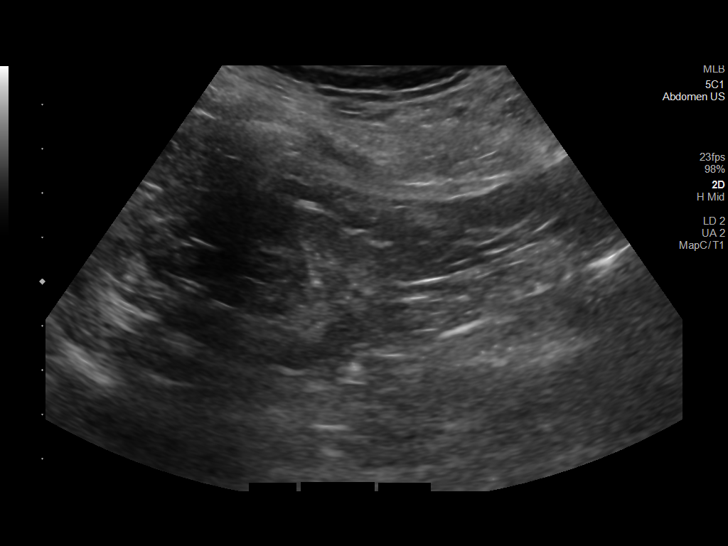
[im 10/10]
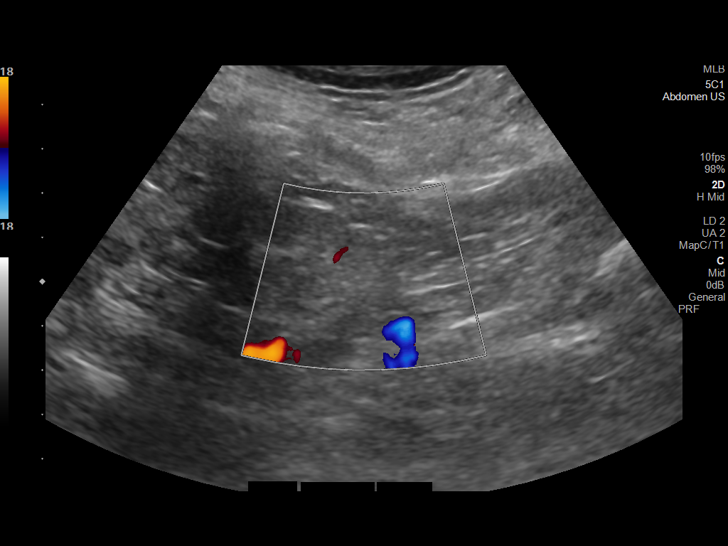

[10 of 10 positions shown; findings below may reference images not displayed]

EXAM:
ULTRASOUND GUIDED CORE BIOPSY OF LEFT KIDNEY

MEDICATIONS:
None.

ANESTHESIA/SEDATION:
Fentanyl 75 mcg IV; Versed 2.0 mg IV

Moderate Sedation Time:  17 minutes.

The patient was continuously monitored during the procedure by the
interventional radiology nurse under my direct supervision.

PROCEDURE:
The procedure, risks, benefits, and alternatives were explained to
the patient. Questions regarding the procedure were encouraged and
answered. The patient understands and consents to the procedure. A
time-out was performed prior to initiating the procedure.

Both kidneys were examined by ultrasound in a prone position. The
left flank region was prepped with chlorhexidine in a sterile
fashion, and a sterile drape was applied covering the operative
field. A sterile gown and sterile gloves were used for the
procedure. Local anesthesia was provided with 1% Lidocaine.

Under ultrasound guidance, 2 separate core biopsy samples were
obtained at the level of lower pole renal cortex of the left kidney
utilizing a 16 gauge core biopsy device. Core biopsy samples were
submitted in saline. Additional post biopsy ultrasound imaging was
performed.

COMPLICATIONS:
None immediate.
FINDINGS: Two separate solid and intact core biopsy samples were obtained.
There was no immediate evidence of hemorrhage by ultrasound
following the procedure.
IMPRESSION: Ultrasound-guided core biopsy performed of the left kidney at the
level of lower pole cortex.
